# Patient Record
Sex: Female | Born: 1945 | Race: White | Hispanic: No | Marital: Married | Smoking: Never smoker
Health system: Southern US, Community
[De-identification: ages and names within clinical notes are randomized; demographics above are authoritative.]

## PROBLEM LIST (undated history)

## (undated) DIAGNOSIS — L309 Dermatitis, unspecified: Secondary | ICD-10-CM

## (undated) DIAGNOSIS — H269 Unspecified cataract: Secondary | ICD-10-CM

## (undated) DIAGNOSIS — E785 Hyperlipidemia, unspecified: Secondary | ICD-10-CM

## (undated) DIAGNOSIS — M8580 Other specified disorders of bone density and structure, unspecified site: Secondary | ICD-10-CM

## (undated) DIAGNOSIS — I10 Essential (primary) hypertension: Secondary | ICD-10-CM

## (undated) HISTORY — DX: Unspecified cataract: H26.9

## (undated) HISTORY — DX: Essential (primary) hypertension: I10

## (undated) HISTORY — DX: Other specified disorders of bone density and structure, unspecified site: M85.80

## (undated) HISTORY — DX: Dermatitis, unspecified: L30.9

## (undated) HISTORY — DX: Hyperlipidemia, unspecified: E78.5

---

## 2008-11-21 HISTORY — PX: EYE SURGERY: SHX253

## 2011-01-05 ENCOUNTER — Encounter (INDEPENDENT_AMBULATORY_CARE_PROVIDER_SITE_OTHER): Payer: BC Managed Care – PPO | Admitting: Internal Medicine

## 2011-01-05 ENCOUNTER — Other Ambulatory Visit (HOSPITAL_COMMUNITY)
Admission: RE | Admit: 2011-01-05 | Discharge: 2011-01-05 | Disposition: A | Discharge status: Home / Self Care | Payer: BC Managed Care – PPO | Source: Ambulatory Visit | Attending: Internal Medicine | Admitting: Internal Medicine

## 2011-01-05 ENCOUNTER — Other Ambulatory Visit: Payer: Self-pay | Admitting: Internal Medicine

## 2011-01-05 DIAGNOSIS — Z23 Encounter for immunization: Secondary | ICD-10-CM

## 2011-01-05 DIAGNOSIS — Z0000 Encounter for general adult medical examination without abnormal findings: Secondary | ICD-10-CM | POA: Insufficient documentation

## 2011-01-05 DIAGNOSIS — Z1231 Encounter for screening mammogram for malignant neoplasm of breast: Secondary | ICD-10-CM

## 2011-01-05 DIAGNOSIS — I10 Essential (primary) hypertension: Secondary | ICD-10-CM

## 2011-01-05 LAB — HM PAP SMEAR

## 2011-01-06 ENCOUNTER — Other Ambulatory Visit: Payer: BC Managed Care – PPO | Admitting: Internal Medicine

## 2011-01-11 ENCOUNTER — Ambulatory Visit
Admission: RE | Admit: 2011-01-11 | Discharge: 2011-01-11 | Disposition: A | Discharge status: Home / Self Care | Payer: BC Managed Care – PPO | Source: Ambulatory Visit | Attending: Internal Medicine | Admitting: Internal Medicine

## 2011-01-11 DIAGNOSIS — Z1231 Encounter for screening mammogram for malignant neoplasm of breast: Secondary | ICD-10-CM

## 2011-01-13 ENCOUNTER — Other Ambulatory Visit: Payer: Self-pay | Admitting: Internal Medicine

## 2011-01-13 ENCOUNTER — Other Ambulatory Visit: Payer: Self-pay | Admitting: Dermatology

## 2011-01-13 DIAGNOSIS — R928 Other abnormal and inconclusive findings on diagnostic imaging of breast: Secondary | ICD-10-CM

## 2011-01-19 ENCOUNTER — Ambulatory Visit
Admission: RE | Admit: 2011-01-19 | Discharge: 2011-01-19 | Disposition: A | Discharge status: Home / Self Care | Payer: BC Managed Care – PPO | Source: Ambulatory Visit | Attending: Internal Medicine | Admitting: Internal Medicine

## 2011-01-19 DIAGNOSIS — R928 Other abnormal and inconclusive findings on diagnostic imaging of breast: Secondary | ICD-10-CM

## 2011-04-14 ENCOUNTER — Other Ambulatory Visit: Payer: Medicare Other | Admitting: Internal Medicine

## 2011-04-15 ENCOUNTER — Other Ambulatory Visit: Payer: Self-pay | Admitting: *Deleted

## 2011-04-15 DIAGNOSIS — E039 Hypothyroidism, unspecified: Secondary | ICD-10-CM

## 2011-04-15 MED ORDER — LEVOTHYROXINE SODIUM 50 MCG PO TABS: 50.0000 ug | ORAL_TABLET | Freq: Every day | ORAL | Status: DC

## 2011-09-04 ENCOUNTER — Other Ambulatory Visit: Payer: Self-pay | Admitting: Internal Medicine

## 2011-11-07 ENCOUNTER — Encounter: Payer: Self-pay | Admitting: Internal Medicine

## 2011-11-07 ENCOUNTER — Telehealth: Payer: Self-pay | Admitting: Internal Medicine

## 2011-11-07 NOTE — Telephone Encounter (Signed)
Should have had 6 month recheck in November. That is why Rx is running out. Can she come in tomorrow for OV and TSH?

## 2011-11-08 ENCOUNTER — Encounter: Payer: Self-pay | Admitting: Internal Medicine

## 2011-11-08 ENCOUNTER — Ambulatory Visit (INDEPENDENT_AMBULATORY_CARE_PROVIDER_SITE_OTHER): Payer: Medicare Other | Admitting: Internal Medicine

## 2011-11-08 VITALS — BP 136/82 | HR 76 | Temp 98.4°F | Wt 154.0 lb

## 2011-11-08 DIAGNOSIS — M8580 Other specified disorders of bone density and structure, unspecified site: Secondary | ICD-10-CM

## 2011-11-08 DIAGNOSIS — E559 Vitamin D deficiency, unspecified: Secondary | ICD-10-CM

## 2011-11-08 DIAGNOSIS — E039 Hypothyroidism, unspecified: Secondary | ICD-10-CM

## 2011-11-08 DIAGNOSIS — L259 Unspecified contact dermatitis, unspecified cause: Secondary | ICD-10-CM

## 2011-11-08 DIAGNOSIS — Z860100 Personal history of colon polyps, unspecified: Secondary | ICD-10-CM

## 2011-11-08 DIAGNOSIS — M899 Disorder of bone, unspecified: Secondary | ICD-10-CM

## 2011-11-08 DIAGNOSIS — I10 Essential (primary) hypertension: Secondary | ICD-10-CM

## 2011-11-08 DIAGNOSIS — M949 Disorder of cartilage, unspecified: Secondary | ICD-10-CM

## 2011-11-08 DIAGNOSIS — L309 Dermatitis, unspecified: Secondary | ICD-10-CM

## 2011-11-09 ENCOUNTER — Other Ambulatory Visit: Payer: Self-pay

## 2011-11-09 MED ORDER — LEVOTHYROXINE SODIUM 75 MCG PO TABS: 75.0000 ug | ORAL_TABLET | Freq: Every day | ORAL | Status: DC

## 2011-11-21 DIAGNOSIS — E039 Hypothyroidism, unspecified: Secondary | ICD-10-CM | POA: Insufficient documentation

## 2011-11-21 DIAGNOSIS — L309 Dermatitis, unspecified: Secondary | ICD-10-CM | POA: Insufficient documentation

## 2011-11-21 DIAGNOSIS — I10 Essential (primary) hypertension: Secondary | ICD-10-CM | POA: Insufficient documentation

## 2011-11-21 DIAGNOSIS — Z86010 Personal history of colonic polyps: Secondary | ICD-10-CM | POA: Insufficient documentation

## 2011-11-21 DIAGNOSIS — M8580 Other specified disorders of bone density and structure, unspecified site: Secondary | ICD-10-CM | POA: Insufficient documentation

## 2011-11-21 DIAGNOSIS — E559 Vitamin D deficiency, unspecified: Secondary | ICD-10-CM | POA: Insufficient documentation

## 2011-11-21 NOTE — Patient Instructions (Signed)
We will advise you with regard to results of thyroid stimulating hormone assay. You may need increased dose of Synthroid. Otherwise return in 6 months for physical examination. Continue metoprolol for hypertension.

## 2011-11-21 NOTE — Progress Notes (Signed)
  Subjective:    Patient ID: Amanda Potts, female    DOB: March 13, 1946, 65 y.o.   MRN: [id number]  HPI 65 year old white female with hypothyroidism in today for six-month recheck. Also has history of hypertension. She and her husband have been trying to move here permanently from [place] for a number of months. 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 Is allergic to sulfa. Had cataract extraction left eye 2010. Has occasional eczema and uses Diprolene cream 0.05% sparingly. History of mild osteopenia. Was given tetanus immunization update February 2012. History of colonoscopy in 2009. Tried Actonel for osteopenia but it caused myalgias. She switch to Fosamax and gave her a lot of heartburn. Have suggested she just daily calcium and vitamin D at this point in time. Nonsmoker. Social alcohol consumption.  Family history father died at age 33 with dementia and strokes. Mother died at age 39 with COPD and heart failure. Brother died at age 79 of lung cancer and was a smoker. One brother living age 67 in good health. Husband is 40 years old and is in good health. They have 3 adult children. They have one set of twins who are 9 years older son and a daughter. Twin son lives in [place] and when daughter lives in [place]. Oldest son, Rayna Sexton, lives here in [place]. They have several grandchildren.   Review of Systems     Objective:   Physical Exam neck supple without thyromegaly or carotid bruits; chest clear to auscultation; cardiac exam regular rate and rhythm; extremities without edema        Assessment & Plan:  Hypertension  Hypothyroidism  Eczema  Osteopenia  Vitamin D deficiency   Plan: Patient is return in 6 months for physical examination.

## 2012-01-09 ENCOUNTER — Telehealth: Payer: Self-pay | Admitting: Internal Medicine

## 2012-01-09 DIAGNOSIS — I10 Essential (primary) hypertension: Secondary | ICD-10-CM

## 2012-01-09 MED ORDER — METOPROLOL SUCCINATE 12.5 MG HALF TABLET: 12.5000 mg | ORAL_TABLET | Freq: Every day | ORAL | Status: DC

## 2012-01-09 NOTE — Telephone Encounter (Signed)
Rx called to [facility] [place]

## 2012-02-06 ENCOUNTER — Other Ambulatory Visit: Payer: Medicare Other | Admitting: Internal Medicine

## 2012-02-24 ENCOUNTER — Other Ambulatory Visit: Payer: Medicare Other | Admitting: Internal Medicine

## 2012-02-24 DIAGNOSIS — E039 Hypothyroidism, unspecified: Secondary | ICD-10-CM

## 2012-03-01 ENCOUNTER — Other Ambulatory Visit: Payer: Self-pay | Admitting: Internal Medicine

## 2012-03-01 DIAGNOSIS — Z1231 Encounter for screening mammogram for malignant neoplasm of breast: Secondary | ICD-10-CM

## 2012-03-12 ENCOUNTER — Ambulatory Visit
Admission: RE | Admit: 2012-03-12 | Discharge: 2012-03-12 | Disposition: A | Discharge status: Home / Self Care | Payer: Medicare Other | Source: Ambulatory Visit | Attending: Internal Medicine | Admitting: Internal Medicine

## 2012-03-12 DIAGNOSIS — Z1231 Encounter for screening mammogram for malignant neoplasm of breast: Secondary | ICD-10-CM | POA: Diagnosis not present

## 2012-03-14 ENCOUNTER — Other Ambulatory Visit: Payer: Self-pay

## 2012-03-14 MED ORDER — LEVOTHYROXINE SODIUM 75 MCG PO TABS: 75.0000 ug | ORAL_TABLET | Freq: Every day | ORAL | Status: DC

## 2012-05-10 ENCOUNTER — Other Ambulatory Visit: Payer: Self-pay | Admitting: Internal Medicine

## 2012-05-28 ENCOUNTER — Other Ambulatory Visit: Payer: Medicare Other | Admitting: Internal Medicine

## 2012-05-28 DIAGNOSIS — E559 Vitamin D deficiency, unspecified: Secondary | ICD-10-CM

## 2012-05-28 DIAGNOSIS — E039 Hypothyroidism, unspecified: Secondary | ICD-10-CM | POA: Diagnosis not present

## 2012-05-28 DIAGNOSIS — I10 Essential (primary) hypertension: Secondary | ICD-10-CM | POA: Diagnosis not present

## 2012-05-28 LAB — CBC WITH DIFFERENTIAL/PLATELET
Eosinophils Absolute: 0.2 10*3/uL (ref 0.0–0.7)
Eosinophils Relative: 4 % (ref 0–5)
HCT: 40.9 % (ref 36.0–46.0)
Hemoglobin: 13.2 g/dL (ref 12.0–15.0)
Lymphocytes Relative: 32 % (ref 12–46)
Lymphs Abs: 1.9 10*3/uL (ref 0.7–4.0)
MCH: 29.9 pg (ref 26.0–34.0)
MCV: 92.5 fL (ref 78.0–100.0)
Monocytes Absolute: 0.4 10*3/uL (ref 0.1–1.0)
Monocytes Relative: 8 % (ref 3–12)
RBC: 4.42 MIL/uL (ref 3.87–5.11)

## 2012-05-28 LAB — LIPID PANEL
Cholesterol: 208 mg/dL — ABNORMAL HIGH (ref 0–200)
HDL: 67 mg/dL (ref >39)
Total CHOL/HDL Ratio: 3.1 Ratio

## 2012-05-28 LAB — COMPREHENSIVE METABOLIC PANEL
CO2: 26 mEq/L (ref 19–32)
Calcium: 9 mg/dL (ref 8.4–10.5)
Chloride: 106 mEq/L (ref 96–112)
Creat: 0.74 mg/dL (ref 0.50–1.10)
Glucose, Bld: 98 mg/dL (ref 70–99)
Total Bilirubin: 0.5 mg/dL (ref 0.3–1.2)
Total Protein: 6.6 g/dL (ref 6.0–8.3)

## 2012-05-28 LAB — TSH: TSH: 0.681 u[IU]/mL (ref 0.350–4.500)

## 2012-05-29 ENCOUNTER — Encounter: Payer: Self-pay | Admitting: Internal Medicine

## 2012-05-29 ENCOUNTER — Ambulatory Visit (INDEPENDENT_AMBULATORY_CARE_PROVIDER_SITE_OTHER): Payer: Medicare Other | Admitting: Internal Medicine

## 2012-05-29 VITALS — BP 140/78 | HR 68 | Temp 98.8°F | Ht 66.0 in | Wt 154.0 lb

## 2012-05-29 DIAGNOSIS — E039 Hypothyroidism, unspecified: Secondary | ICD-10-CM

## 2012-05-29 DIAGNOSIS — Z8639 Personal history of other endocrine, nutritional and metabolic disease: Secondary | ICD-10-CM

## 2012-05-29 DIAGNOSIS — Z0000 Encounter for general adult medical examination without abnormal findings: Secondary | ICD-10-CM

## 2012-05-29 DIAGNOSIS — M899 Disorder of bone, unspecified: Secondary | ICD-10-CM | POA: Diagnosis not present

## 2012-05-29 DIAGNOSIS — L259 Unspecified contact dermatitis, unspecified cause: Secondary | ICD-10-CM | POA: Diagnosis not present

## 2012-05-29 DIAGNOSIS — M949 Disorder of cartilage, unspecified: Secondary | ICD-10-CM | POA: Diagnosis not present

## 2012-05-29 DIAGNOSIS — M8580 Other specified disorders of bone density and structure, unspecified site: Secondary | ICD-10-CM

## 2012-05-29 DIAGNOSIS — Z23 Encounter for immunization: Secondary | ICD-10-CM

## 2012-05-29 DIAGNOSIS — I10 Essential (primary) hypertension: Secondary | ICD-10-CM

## 2012-05-29 LAB — VITAMIN D 25 HYDROXY (VIT D DEFICIENCY, FRACTURES): Vit D, 25-Hydroxy: 36 ng/mL (ref 30–89)

## 2012-05-29 MED ORDER — PNEUMOCOCCAL VAC POLYVALENT 25 MCG/0.5ML IJ INJ: 0.5000 mL | INJECTION | Freq: Once | INTRAMUSCULAR | Status: DC

## 2012-05-30 LAB — POCT URINALYSIS DIPSTICK
Bilirubin, UA: NEGATIVE
Blood, UA: NEGATIVE
Glucose, UA: NEGATIVE
Spec Grav, UA: 1.01
Urobilinogen, UA: NEGATIVE

## 2012-06-16 ENCOUNTER — Encounter: Payer: Self-pay | Admitting: Internal Medicine

## 2012-06-16 NOTE — Patient Instructions (Addendum)
Continue same medications and return in one year. 

## 2012-06-16 NOTE — Progress Notes (Signed)
Subjective:    Patient ID: Amanda Potts, female    DOB: [birth year], 66 y.o.   MRN: [id number]  HPI 66 year old white female with history of hypertension, eczema, osteopenia, hypothyroidism and vitamin D deficiency her health maintenance and evaluation of medical problems. Patient and her husband have moved here nail permanently from [place]. 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 Is allergic to sulfa. Had cataract extraction left eye 2010. History of mild osteopenia. History of colonoscopy 2009. Tried Actonel for osteopenia but it caused myalgias. She tried Fosamax but he gave her a lot of heartburn. At this point we are just going to treat her with calcium and vitamin D. She is a nonsmoker with social alcohol consumption.  Family history: Father died at age 32 with dementia and stroke. Mother died at age 66 with COPD and heart failure. One brother died at age 66 of lung cancer and was a smoker. One brother living age 66 in good health. Husband is in good health. They have 3 adult children. They have one set of twins who are 48 years old a son and a daughter. Twin son lives in [place] and daughter resides in [place]. Oldest son resides here in [place]. They have several grandchildren.   Had mammogram February 2012.  Subjective:   Patient presents for Medicare Annual/Subsequent preventive examination.   Review Past Medical/Family/Social: as above   Risk Factors  Current exercise habits: walking Dietary issues discussed: low fat low carb diet suggested   Cardiac risk factors:  Depression Screen  (Note: if answer to either of the following is "Yes", a more complete depression screening is indicated)   Over the past two weeks, have you felt down, depressed or hopeless? No  Over the past two weeks, have you felt little interest or pleasure in doing things? No Have you lost  interest or pleasure in daily life? No Do you often feel hopeless? No Do you cry easily over simple problems? No   Activities of Daily Living  In your present state of health, do you have any difficulty performing the following activities?:   Driving? No  Managing money? No  Feeding yourself? No  Getting from bed to chair? No  Climbing a flight of stairs? No  Preparing food and eating?: No  Bathing or showering? No  Getting dressed: No  Getting to the toilet? No  Using the toilet:No  Moving around from place to place: No  In the past year have you fallen or had a near fall?:No  Are you sexually active? No  Do you have more than one partner? No   Hearing Difficulties: No  Do you often ask people to speak up or repeat themselves? No  Do you experience ringing or noises in your ears? No  Do you have difficulty understanding soft or whispered voices? No  Do you feel that you have a problem with memory? No Do you often misplace items? No    Home Safety:  Do you have a smoke alarm at your residence? Yes Do you have grab bars in the bathroom?yes Do you have throw rugs in your house?yes   Cognitive Testing  Alert? Yes Normal Appearance?Yes  Oriented to Potts? Yes Place? Yes  Time? Yes  Recall of three objects? Yes  Can perform simple calculations? Yes  Displays appropriate judgment?Yes  Can read the correct time from a watch face?Yes   List  the Names of Other Physician/Practitioners you currently use:  See referral list for the physicians patient is currently seeing.     Review of Systems: see EPIC ROS   Objective:     General appearance: Appears stated age  Head: Normocephalic, without obvious abnormality, atraumatic  Eyes: conj clear, EOMi PEERLA  Ears: normal TM's and external ear canals both ears  Nose: Nares normal. Septum midline. Mucosa normal. No drainage or sinus tenderness.  Throat: lips, mucosa, and tongue normal; teeth and gums normal  Neck: no  adenopathy, no carotid bruit, no JVD, supple, symmetrical, trachea midline and thyroid not enlarged, symmetric, no tenderness/mass/nodules  No CVA tenderness.  Lungs: clear to auscultation bilaterally  Breasts: normal appearance, no masses or tenderness,  Heart: regular rate and rhythm, S1, S2 normal, no murmur, click, rub or gallop  Abdomen: soft, non-tender; bowel sounds normal; no masses, no organomegaly  Musculoskeletal: ROM normal in all joints, no crepitus, no deformity, Normal muscle strengthen. Back  is symmetric, no curvature. Skin: Skin color, texture, turgor normal. No rashes or lesions  Lymph nodes: Cervical, supraclavicular, and axillary nodes normal.  Neurologic: CN 2 -12 Normal, Normal symmetric reflexes. Normal coordination and gait  Psych: Alert & Oriented x 3, Mood appear stable.    Assessment:    Annual wellness medicare exam   Plan:    During the course of the visit the patient was educated and counseled about appropriate screening and preventive services including:        Patient Instructions (the written plan) was given to the patient.  Medicare Attestation  I have personally reviewed:  The patient's medical and social history  Their use of alcohol, tobacco or illicit drugs  Their current medications and supplements  The patient's functional ability including ADLs,fall risks, home safety risks, cognitive, and hearing and visual impairment  Diet and physical activities  Evidence for depression or mood disorders  The patient's weight, height, BMI, and visual acuity have been recorded in the chart. I have made referrals, counseling, and provided education to the patient based on review of the above and I have provided the patient with a written personalized care plan for preventive services.       Review of Systems  Constitutional: Negative.   All other systems reviewed and are negative.       Objective:   Physical Exam  Vitals  reviewed. Constitutional: She is oriented to Potts, place, and time. She appears well-developed and well-nourished. No distress.  HENT:  Head: Normocephalic and atraumatic.  Right Ear: External ear normal.  Left Ear: External ear normal.  Mouth/Throat: Oropharynx is clear and moist. No oropharyngeal exudate.  Eyes: Conjunctivae and EOM are normal. Pupils are equal, round, and reactive to light. Right eye exhibits no discharge. Left eye exhibits no discharge. No scleral icterus.  Neck: Normal range of motion. Neck supple. No JVD present. No thyromegaly present.  Cardiovascular: Normal rate, regular rhythm, normal heart sounds and intact distal pulses.   No murmur heard. Pulmonary/Chest: Effort normal and breath sounds normal. No respiratory distress. She has no rales.       Breasts normal female  Abdominal: Soft. Bowel sounds are normal. She exhibits no distension and no mass. There is no tenderness. There is no guarding.  Genitourinary:       Pap smear done 01/06/2011  Musculoskeletal: Normal range of motion. She exhibits no edema.  Lymphadenopathy:    She has no cervical adenopathy.  Neurological: She is alert and oriented to  Potts, place, and time. She has normal reflexes. No cranial nerve deficit. Coordination normal.  Skin: Skin is warm and dry. No rash noted. She is not diaphoretic.  Psychiatric: She has a normal mood and affect. Her behavior is normal. Thought content normal.          Assessment & Plan:  Hypertension  Hypothyroidism  Osteopenia  History of vitamin D deficiency  Eczema  Plan: Patient to return in one year or as needed. Patient had colonoscopy in 2009. Was given prescription to get Zostavax vaccine done at drugstore. Given Pneumovax immunization today. Had Tdap 01/06/2011

## 2012-06-25 ENCOUNTER — Telehealth: Payer: Self-pay

## 2012-06-25 DIAGNOSIS — M8580 Other specified disorders of bone density and structure, unspecified site: Secondary | ICD-10-CM

## 2012-06-25 NOTE — Telephone Encounter (Signed)
Patient informed/ order entered in Epic for Bone Density. She may call them to schedule.

## 2012-07-13 ENCOUNTER — Telehealth: Payer: Self-pay | Admitting: Internal Medicine

## 2012-07-13 NOTE — Telephone Encounter (Signed)
Rx verified with pharmacist/should be on Metoprolol tartrate 25mg  1/2 tablet daily

## 2012-07-16 ENCOUNTER — Other Ambulatory Visit: Payer: Medicare Other

## 2012-07-19 ENCOUNTER — Ambulatory Visit
Admission: RE | Admit: 2012-07-19 | Discharge: 2012-07-19 | Disposition: A | Discharge status: Home / Self Care | Payer: Medicare Other | Source: Ambulatory Visit | Attending: Internal Medicine | Admitting: Internal Medicine

## 2012-07-19 DIAGNOSIS — M8580 Other specified disorders of bone density and structure, unspecified site: Secondary | ICD-10-CM

## 2012-07-19 DIAGNOSIS — M899 Disorder of bone, unspecified: Secondary | ICD-10-CM | POA: Diagnosis not present

## 2012-12-04 ENCOUNTER — Encounter: Payer: Self-pay | Admitting: Internal Medicine

## 2012-12-04 ENCOUNTER — Ambulatory Visit (INDEPENDENT_AMBULATORY_CARE_PROVIDER_SITE_OTHER): Payer: Medicare Other | Admitting: Internal Medicine

## 2012-12-04 ENCOUNTER — Other Ambulatory Visit: Payer: Self-pay | Admitting: Internal Medicine

## 2012-12-04 VITALS — BP 126/70 | HR 72 | Temp 98.2°F | Wt 141.5 lb

## 2012-12-04 DIAGNOSIS — E039 Hypothyroidism, unspecified: Secondary | ICD-10-CM

## 2012-12-04 DIAGNOSIS — I10 Essential (primary) hypertension: Secondary | ICD-10-CM

## 2012-12-04 LAB — TSH: TSH: 3.935 u[IU]/mL (ref 0.350–4.500)

## 2012-12-04 MED ORDER — METOPROLOL SUCCINATE 12.5 MG HALF TABLET: 12.5000 mg | ORAL_TABLET | Freq: Every day | ORAL | Status: DC

## 2012-12-04 MED ORDER — LEVOTHYROXINE SODIUM 75 MCG PO TABS: 75.0000 ug | ORAL_TABLET | Freq: Every day | ORAL | Status: DC

## 2012-12-04 NOTE — Addendum Note (Signed)
Addended by: Judy Pimple on: 12/04/2012 11:32 AM   Modules accepted: Orders

## 2012-12-04 NOTE — Progress Notes (Signed)
  Subjective:    Patient ID: Amanda Potts, female    DOB: December 21, 1945, 67 y.o.   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 History of hypertension, osteopenia, hypothyroidism and vitamin D deficiency. Declines influenza immunization. No complaints or problems today. TSH drawn and is pending. Remains on metoprolol for hypertension with good control of blood pressure. Is on Synthroid for hypothyroidism 0.075 mg daily. Takes calcium and vitamin D.  No falls in the past year and is not depressed.     Review of Systems     Objective:   Physical Exam chest is clear to auscultation. Cardiac exam regular rate and rhythm normal S1 and S2. Skin is pale warm and dry. No thyromegaly. No carotid bruits or JVD. Extremities without edema        Assessment & Plan:  Hypothyroidism-TSH pending  History of vitamin D deficiency-continue calcium and vitamin D supplementation  Hypertension-well controlled on metoprolol  Osteopenia-treated with calcium and vitamin D  History of eczema  Plan: Return in late July for physical examination

## 2012-12-04 NOTE — Patient Instructions (Addendum)
Continue same meds and recheck in 6 months

## 2012-12-07 ENCOUNTER — Other Ambulatory Visit: Payer: Self-pay | Admitting: Internal Medicine

## 2012-12-07 NOTE — Telephone Encounter (Signed)
Keep getting Rx request. Please refill for 6 months.

## 2012-12-07 NOTE — Telephone Encounter (Signed)
Patient is under assumption that her Synthroid dose is 50 mcg, but it was changed approx 1 year ago to 75 mcg when TSH was elevated. Left a message for her to call us if she had questions, and advised pharmacist of correct dose.

## 2013-01-16 DIAGNOSIS — H2510 Age-related nuclear cataract, unspecified eye: Secondary | ICD-10-CM | POA: Diagnosis not present

## 2013-04-29 ENCOUNTER — Other Ambulatory Visit: Payer: Self-pay

## 2013-04-29 DIAGNOSIS — Z1231 Encounter for screening mammogram for malignant neoplasm of breast: Secondary | ICD-10-CM

## 2013-05-23 ENCOUNTER — Ambulatory Visit: Payer: Medicare Other

## 2013-06-05 ENCOUNTER — Other Ambulatory Visit: Payer: Self-pay | Admitting: Internal Medicine

## 2013-06-12 ENCOUNTER — Ambulatory Visit
Admission: RE | Admit: 2013-06-12 | Discharge: 2013-06-12 | Disposition: A | Discharge status: Home / Self Care | Payer: Medicare Other | Source: Ambulatory Visit

## 2013-06-12 DIAGNOSIS — Z1231 Encounter for screening mammogram for malignant neoplasm of breast: Secondary | ICD-10-CM

## 2013-06-18 ENCOUNTER — Other Ambulatory Visit: Payer: Medicare Other | Admitting: Internal Medicine

## 2013-06-20 ENCOUNTER — Encounter: Payer: Medicare Other | Admitting: Internal Medicine

## 2013-07-02 ENCOUNTER — Other Ambulatory Visit: Payer: Medicare Other | Admitting: Internal Medicine

## 2013-07-02 ENCOUNTER — Other Ambulatory Visit: Payer: Self-pay | Admitting: Internal Medicine

## 2013-07-02 DIAGNOSIS — I10 Essential (primary) hypertension: Secondary | ICD-10-CM

## 2013-07-02 DIAGNOSIS — Z1329 Encounter for screening for other suspected endocrine disorder: Secondary | ICD-10-CM

## 2013-07-02 DIAGNOSIS — Z13220 Encounter for screening for lipoid disorders: Secondary | ICD-10-CM

## 2013-07-02 DIAGNOSIS — E039 Hypothyroidism, unspecified: Secondary | ICD-10-CM

## 2013-07-02 DIAGNOSIS — R7301 Impaired fasting glucose: Secondary | ICD-10-CM | POA: Diagnosis not present

## 2013-07-02 DIAGNOSIS — M899 Disorder of bone, unspecified: Secondary | ICD-10-CM | POA: Diagnosis not present

## 2013-07-02 DIAGNOSIS — M949 Disorder of cartilage, unspecified: Secondary | ICD-10-CM | POA: Diagnosis not present

## 2013-07-02 LAB — LIPID PANEL
Cholesterol: 191 mg/dL (ref 0–200)
HDL: 70 mg/dL (ref >39)
LDL Cholesterol: 105 mg/dL — ABNORMAL HIGH (ref 0–99)
Total CHOL/HDL Ratio: 2.7 Ratio
Triglycerides: 82 mg/dL (ref <150)
VLDL: 16 mg/dL (ref 0–40)

## 2013-07-02 LAB — COMPREHENSIVE METABOLIC PANEL
Alkaline Phosphatase: 41 U/L (ref 39–117)
BUN: 19 mg/dL (ref 6–23)
Creat: 1.03 mg/dL (ref 0.50–1.10)
Glucose, Bld: 101 mg/dL — ABNORMAL HIGH (ref 70–99)
Sodium: 139 mEq/L (ref 135–145)
Total Bilirubin: 0.8 mg/dL (ref 0.3–1.2)
Total Protein: 6.2 g/dL (ref 6.0–8.3)

## 2013-07-02 LAB — CBC WITH DIFFERENTIAL/PLATELET
Basophils Relative: 1 % (ref 0–1)
Eosinophils Absolute: 0.2 10*3/uL (ref 0.0–0.7)
Eosinophils Relative: 3 % (ref 0–5)
Hemoglobin: 12.8 g/dL (ref 12.0–15.0)
MCH: 30.6 pg (ref 26.0–34.0)
MCHC: 34 g/dL (ref 30.0–36.0)
MCV: 90 fL (ref 78.0–100.0)
Monocytes Relative: 10 % (ref 3–12)
Neutrophils Relative %: 56 % (ref 43–77)

## 2013-07-04 ENCOUNTER — Encounter: Payer: Self-pay | Admitting: Internal Medicine

## 2013-07-04 ENCOUNTER — Ambulatory Visit (INDEPENDENT_AMBULATORY_CARE_PROVIDER_SITE_OTHER): Payer: Medicare Other | Admitting: Internal Medicine

## 2013-07-04 VITALS — BP 136/82 | HR 80 | Temp 99.3°F | Ht 65.5 in | Wt 131.5 lb

## 2013-07-04 DIAGNOSIS — M899 Disorder of bone, unspecified: Secondary | ICD-10-CM

## 2013-07-04 DIAGNOSIS — M8580 Other specified disorders of bone density and structure, unspecified site: Secondary | ICD-10-CM

## 2013-07-04 DIAGNOSIS — I10 Essential (primary) hypertension: Secondary | ICD-10-CM | POA: Diagnosis not present

## 2013-07-04 DIAGNOSIS — E039 Hypothyroidism, unspecified: Secondary | ICD-10-CM

## 2013-07-04 DIAGNOSIS — Z8639 Personal history of other endocrine, nutritional and metabolic disease: Secondary | ICD-10-CM

## 2013-07-04 DIAGNOSIS — Z872 Personal history of diseases of the skin and subcutaneous tissue: Secondary | ICD-10-CM

## 2013-07-04 DIAGNOSIS — Z86010 Personal history of colonic polyps: Secondary | ICD-10-CM

## 2013-07-04 DIAGNOSIS — Z0000 Encounter for general adult medical examination without abnormal findings: Secondary | ICD-10-CM

## 2013-07-04 LAB — POCT URINALYSIS DIPSTICK
Bilirubin, UA: NEGATIVE
Ketones, UA: NEGATIVE
Protein, UA: NEGATIVE
Spec Grav, UA: 1.02
pH, UA: 6

## 2013-07-04 NOTE — Progress Notes (Signed)
Subjective:    Patient ID: Amanda Potts, female    DOB: 1946-03-04, 67 y.o.   MRN: [id number]  HPI  67 year old White female for health maintenance and evaluation of medical problems. Hx HTN, osteopenia, history of vitamin D deficiency, and hypothyroidism. Has lost weight this  past year through Weight [employer] and now is on maintenance therapy.  Has had cataract extraction of left eye in 2010. Has been told needs right eye done in the future. Says Dr. Kipp Laurence in [place] is eye physician. Colonoscopy done in 2009 with no polyps being found but pt says had colon polyps on previous study before that but no info on what type of polyps were found.  Family History: brother living age 2 in good health. Father died at age 14 with dementia and stroke. Mother died at age 51 with COPD and heart failure. One brother died at age 22 with lung cancer and was a smoker.  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 Nonsmoker. Social alcohol consumption. 3 adult children. One set of twins a son and a daughter who are in their 30s. Daughter resides in [place]. Oldest son lives here in [place]. Has several grandchildren.  History of mild osteopenia. She tried Actonel but it caused myalgias. She tried Fosamax but it caused heartburn. At this point where just couldn't treat her with calcium and vitamin D.  She is allergic to sulfa.    Review of Systems  Constitutional: Negative.   HENT: Positive for postnasal drip.        A.M post nasal drip.  Eyes:       Has cataract to be extracted in next few months  Respiratory: Negative.   Endocrine: Negative.   Genitourinary:       Some urgency  but not incontinence  Allergic/Immunologic: Negative.   Neurological: Negative.   Hematological: Negative.   Psychiatric/Behavioral: Negative.        Objective:   Physical Exam  Vitals  reviewed. Constitutional: She is oriented to person, place, and time. She appears well-developed and well-nourished. No distress.  HENT:  Head: Normocephalic and atraumatic.  Right Ear: External ear normal.  Left Ear: External ear normal.  Mouth/Throat: Oropharynx is clear and moist. No oropharyngeal exudate.  Eyes: Conjunctivae and EOM are normal. Pupils are equal, round, and reactive to light. Right eye exhibits no discharge. Left eye exhibits no discharge.  Neck: Neck supple. No JVD present.  Cardiovascular: Normal rate, regular rhythm and normal heart sounds.   No murmur heard. Pulmonary/Chest: Effort normal and breath sounds normal. No respiratory distress. She has no wheezes. She has no rales.  Breasts normal female  Abdominal: Soft. Bowel sounds are normal. She exhibits no distension and no mass. There is no rebound and no guarding.  Genitourinary:  Pap done 2012. Bimanual normal.  Musculoskeletal: Normal range of motion. She exhibits no edema.  Lymphadenopathy:    She has no cervical adenopathy.  Neurological: She is alert and oriented to person, place, and time. She has normal reflexes. Coordination normal.  Skin: Skin is warm and dry. No rash noted. She is not diaphoretic.  Psychiatric: She has a normal mood and affect. Her behavior is normal. Judgment and thought content normal.          Assessment & Plan:    Hypertension-stable on metoprolol  Hypothyroidism-stable on Synthroid  History of colon polyps-colonoscopy  done 2009. No polyps noted on that report but had colon polyps on previous study according to patient  .History of eczema- stable  History of vitamin D deficiency  History of mild osteopenia    Plan: Return in 6 months or as needed. Continue same medications. Recommend annual flu vaccine. Recommend annual mammogram.      Subjective:   Patient presents for Medicare Annual/Subsequent preventive examination.   Review Past Medical/Family/Social:  see EPIC   Risk Factors  Current exercise habits: walking Dietary issues discussed: lost weight from weight watchers over past year  Cardiac risk factors:  Depression Screen  (Note: if answer to either of the following is "Yes", a more complete depression screening is indicated)   Over the past two weeks, have you felt down, depressed or hopeless? No  Over the past two weeks, have you felt little interest or pleasure in doing things? No Have you lost interest or pleasure in daily life? No Do you often feel hopeless? No Do you cry easily over simple problems? No   Activities of Daily Living  In your present state of health, do you have any difficulty performing the following activities?:   Driving? No  Managing money? No  Feeding yourself? No  Getting from bed to chair? No  Climbing a flight of stairs? No  Preparing food and eating?: No  Bathing or showering? No  Getting dressed: No  Getting to the toilet? No  Using the toilet:No  Moving around from place to place: No  In the past year have you fallen or had a near fall?:No  Are you sexually active? No  Do you have more than one partner? No   Hearing Difficulties: No  Do you often ask people to speak up or repeat themselves? Sometimes with noise Do you experience ringing or noises in your ears? No  Do you have difficulty understanding soft or whispered voices? No  Do you feel that you have a problem with memory? No Do you often misplace items? No    Home Safety:  Do you have a smoke alarm at your residence? Yes Do you have grab bars in the bathroom? yes Do you have throw rugs in your house? yes   Cognitive Testing  Alert? Yes Normal Appearance?Yes  Oriented to person? Yes Place? Yes  Time? Yes  Recall of three objects? Yes  Can perform simple calculations? Yes  Displays appropriate judgment?Yes  Can read the correct time from a watch face?Yes   List the Names of Other Physician/Practitioners you currently  use:  See referral list for the physicians patient is currently seeing. Dr. Kipp Laurence  opthalmologist in [place] plans to do right cataract extraction in near future    Review of Systems: see above   Objective:     General appearance: Appears stated age and mildly obese  Head: Normocephalic, without obvious abnormality, atraumatic  Eyes: conj clear, EOMi PEERLA  Ears: normal TM's and external ear canals both ears  Nose: Nares normal. Septum midline. Mucosa normal. No drainage or sinus tenderness.  Throat: lips, mucosa, and tongue normal; teeth and gums normal  Neck: no adenopathy, no carotid bruit, no JVD, supple, symmetrical, trachea midline and thyroid not enlarged, symmetric, no tenderness/mass/nodules  No CVA tenderness.  Lungs: clear to auscultation bilaterally  Breasts: normal appearance, no masses or tenderness, top of the pacemaker on left upper chest. Incision well-healed. It is tender.  Heart: regular rate and rhythm, S1, S2 normal, no murmur, click, rub or gallop  Abdomen: soft, non-tender; bowel sounds normal; no masses, no organomegaly  Musculoskeletal: ROM normal in all joints, no crepitus, no deformity, Normal muscle strengthen. Back  is symmetric, no curvature. Skin: Skin color, texture, turgor normal. No rashes or lesions  Lymph nodes: Cervical, supraclavicular, and axillary nodes normal.  Neurologic: CN 2 -12 Normal, Normal symmetric reflexes. Normal coordination and gait  Psych: Alert & Oriented x 3, Mood appear stable.    Assessment:    Annual wellness medicare exam   Plan:    During the course of the visit the patient was educated and counseled about appropriate screening and preventive services including:   Mammogram done in July 2014     Patient Instructions (the written plan) was given to the patient.  Medicare Attestation  I have personally reviewed:  The patient's medical and social history  Their use of alcohol, tobacco or illicit drugs   Their current medications and supplements  The patient's functional ability including ADLs,fall risks, home safety risks, cognitive, and hearing and visual impairment  Diet and physical activities  Evidence for depression or mood disorders  The patient's weight, height, BMI, and visual acuity have been recorded in the chart. I have made referrals, counseling, and provided education to the patient based on review of the above and I have provided the patient with a written personalized care plan for preventive services.

## 2013-07-04 NOTE — Patient Instructions (Addendum)
Continue with same meds and return in 6 months for TSH  and   BP check.

## 2013-07-08 ENCOUNTER — Other Ambulatory Visit: Payer: Self-pay | Admitting: Internal Medicine

## 2013-09-26 ENCOUNTER — Other Ambulatory Visit: Payer: Self-pay

## 2014-01-01 DIAGNOSIS — H2510 Age-related nuclear cataract, unspecified eye: Secondary | ICD-10-CM | POA: Diagnosis not present

## 2014-01-03 ENCOUNTER — Other Ambulatory Visit: Payer: Self-pay | Admitting: Internal Medicine

## 2014-01-06 ENCOUNTER — Other Ambulatory Visit: Payer: Medicare Other | Admitting: Internal Medicine

## 2014-01-06 DIAGNOSIS — E039 Hypothyroidism, unspecified: Secondary | ICD-10-CM

## 2014-01-06 MED ORDER — SYNTHROID 75 MCG PO TABS: 75.0000 ug | ORAL_TABLET | Freq: Every day | ORAL | Status: DC

## 2014-01-06 NOTE — Addendum Note (Signed)
Addended by: Brett Canales on: 01/06/2014 09:22 AM   Modules accepted: Orders

## 2014-01-07 ENCOUNTER — Ambulatory Visit: Payer: Medicare Other | Admitting: Internal Medicine

## 2014-01-07 LAB — TSH: TSH: 2.428 u[IU]/mL (ref 0.350–4.500)

## 2014-01-20 ENCOUNTER — Ambulatory Visit (INDEPENDENT_AMBULATORY_CARE_PROVIDER_SITE_OTHER): Payer: Medicare Other | Admitting: Internal Medicine

## 2014-01-20 ENCOUNTER — Encounter: Payer: Self-pay | Admitting: Internal Medicine

## 2014-01-20 VITALS — BP 126/72 | HR 60 | Temp 98.7°F | Wt 132.0 lb

## 2014-01-20 DIAGNOSIS — E039 Hypothyroidism, unspecified: Secondary | ICD-10-CM | POA: Diagnosis not present

## 2014-01-20 DIAGNOSIS — I10 Essential (primary) hypertension: Secondary | ICD-10-CM | POA: Diagnosis not present

## 2014-01-20 MED ORDER — METOPROLOL SUCCINATE ER 25 MG PO TB24: 25.0000 mg | ORAL_TABLET | Freq: Every day | ORAL | Status: DC

## 2014-01-20 MED ORDER — METOPROLOL SUCCINATE 12.5 MG HALF TABLET: 12.5000 mg | ORAL_TABLET | Freq: Every day | ORAL | Status: DC

## 2014-01-20 NOTE — Progress Notes (Signed)
   Subjective:    Patient ID: Amanda Potts, female    DOB: 1946-01-15, 68 y.o.   MRN: [id number]  HPI  Six-month followup on hypertension and hypothyroidism. TSH normal on current dose of Synthroid. Takes one half of a 25 mg Toprol-XL tablet daily. This was refilled. Synthroid was previously refilled in February. No other complaints or problems. She is to have cataract extraction of right eye March 24 by Dr. Shonna Chock in [place]. Had cataract extraction left eye 5 years ago.    Review of Systems     Objective:   Physical Exam skin is warm and dry. Nodes none. Neck is supple without JVD thyromegaly or carotid bruits. Chest clear to auscultation. Cardiac exam regular rate and rhythm. Extremities without edema        Assessment & Plan:  Hypothyroidism-stable on current dose of Synthroid  Hypertension-blood pressure excellent on metoprolol  Plan: Return in 6 months for physical exam

## 2014-01-20 NOTE — Patient Instructions (Signed)
Continue same dose of Synthroid and continue metoprolol 25 mg one half tablet daily. RTC in 6 months

## 2014-02-11 DIAGNOSIS — H2589 Other age-related cataract: Secondary | ICD-10-CM | POA: Diagnosis not present

## 2014-02-11 DIAGNOSIS — H52209 Unspecified astigmatism, unspecified eye: Secondary | ICD-10-CM | POA: Diagnosis not present

## 2014-02-11 DIAGNOSIS — H269 Unspecified cataract: Secondary | ICD-10-CM | POA: Diagnosis not present

## 2014-02-11 DIAGNOSIS — H2510 Age-related nuclear cataract, unspecified eye: Secondary | ICD-10-CM | POA: Diagnosis not present

## 2014-07-18 ENCOUNTER — Other Ambulatory Visit: Payer: Self-pay

## 2014-07-18 DIAGNOSIS — Z1231 Encounter for screening mammogram for malignant neoplasm of breast: Secondary | ICD-10-CM

## 2014-07-24 ENCOUNTER — Ambulatory Visit
Admission: RE | Admit: 2014-07-24 | Discharge: 2014-07-24 | Disposition: A | Discharge status: Home / Self Care | Payer: Medicare Other | Source: Ambulatory Visit

## 2014-07-24 DIAGNOSIS — Z1231 Encounter for screening mammogram for malignant neoplasm of breast: Secondary | ICD-10-CM

## 2014-07-24 IMAGING — MG MM SCREEN MAMMOGRAM BILATERAL
4 series · 4 of 4 positions shown · non-contrast
Comparison: Previous exam(s).

CLINICAL DATA: Screening.

EXAM:
DIGITAL SCREENING BILATERAL MAMMOGRAM WITH CAD

[R CC]
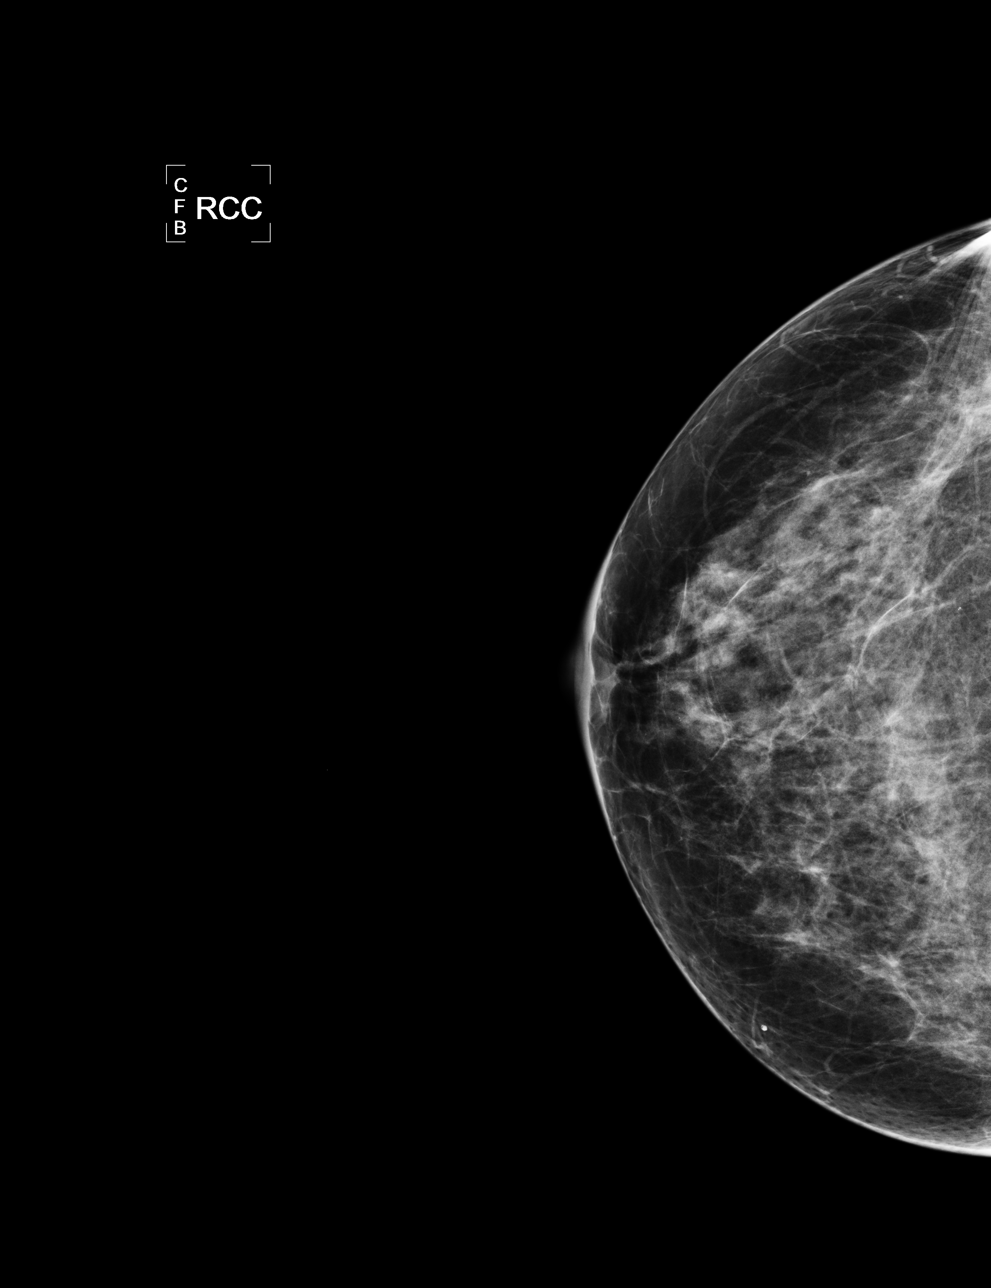

[L CC]
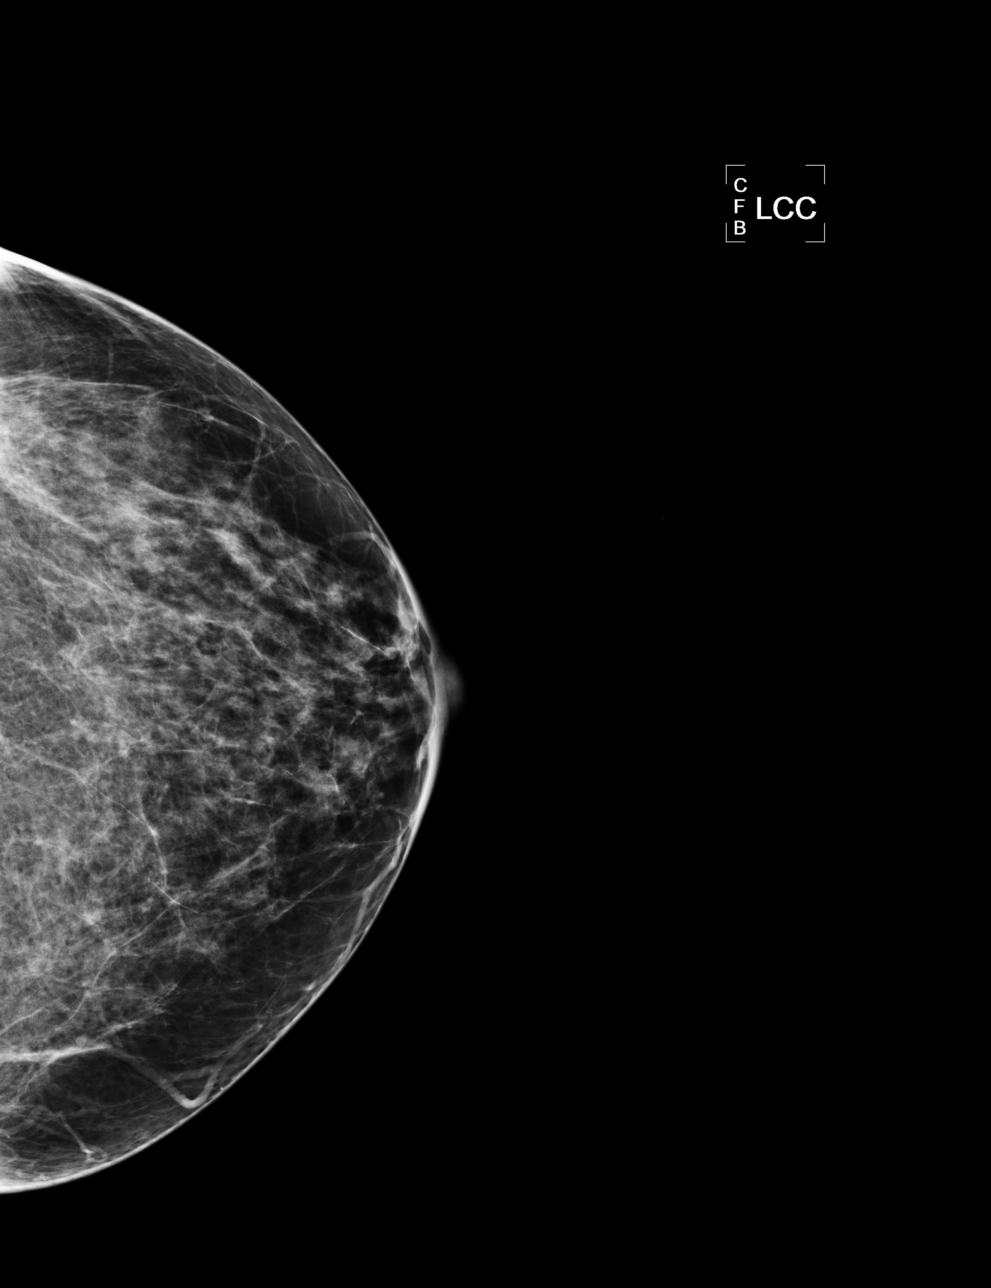

[L MLO]
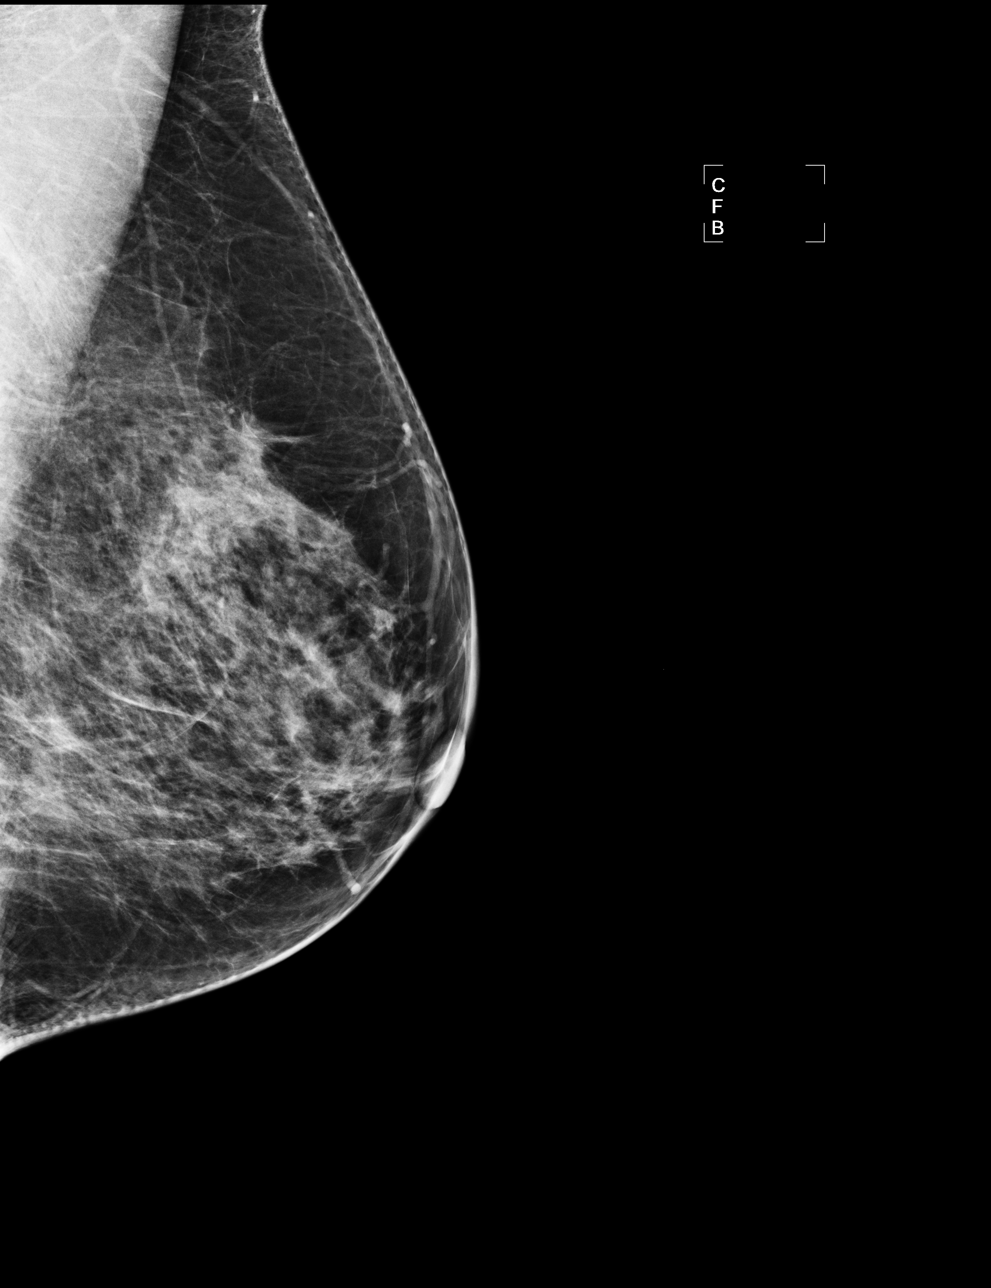

[R MLO]
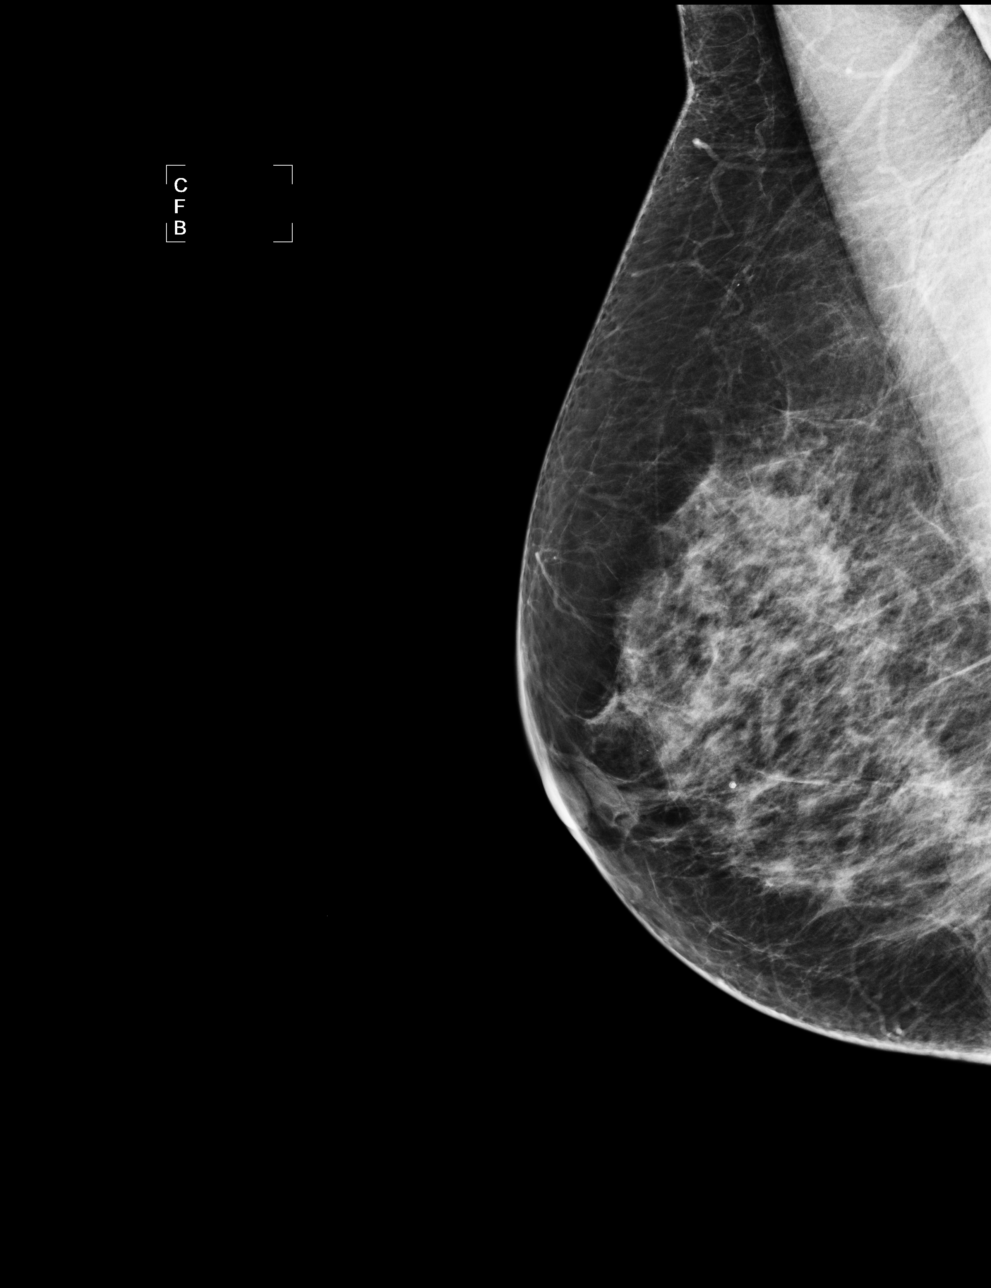

[4 of 4 positions shown; findings below may reference images not displayed]

ACR Breast Density Category c: The breast tissue is heterogeneously
dense, which may obscure small masses.
FINDINGS: There are no findings suspicious for malignancy. Images were
processed with CAD.
IMPRESSION: No mammographic evidence of malignancy. A result letter of this
screening mammogram will be mailed directly to the patient.

RECOMMENDATION:
Screening mammogram in one year. (Code:[0J])

BI-RADS CATEGORY  1: Negative.

## 2014-07-29 ENCOUNTER — Other Ambulatory Visit: Payer: Medicare Other | Admitting: Internal Medicine

## 2014-07-29 DIAGNOSIS — I10 Essential (primary) hypertension: Secondary | ICD-10-CM | POA: Diagnosis not present

## 2014-07-29 DIAGNOSIS — E039 Hypothyroidism, unspecified: Secondary | ICD-10-CM

## 2014-07-29 DIAGNOSIS — E559 Vitamin D deficiency, unspecified: Secondary | ICD-10-CM | POA: Diagnosis not present

## 2014-07-29 DIAGNOSIS — M899 Disorder of bone, unspecified: Secondary | ICD-10-CM | POA: Diagnosis not present

## 2014-07-29 DIAGNOSIS — M949 Disorder of cartilage, unspecified: Secondary | ICD-10-CM | POA: Diagnosis not present

## 2014-07-29 DIAGNOSIS — Z13220 Encounter for screening for lipoid disorders: Secondary | ICD-10-CM | POA: Diagnosis not present

## 2014-07-29 LAB — COMPREHENSIVE METABOLIC PANEL
ALK PHOS: 40 U/L (ref 39–117)
ALT: 12 U/L (ref 0–35)
AST: 17 U/L (ref 0–37)
Albumin: 4.1 g/dL (ref 3.5–5.2)
BILIRUBIN TOTAL: 0.7 mg/dL (ref 0.2–1.2)
BUN: 14 mg/dL (ref 6–23)
CO2: 26 mEq/L (ref 19–32)
Calcium: 9 mg/dL (ref 8.4–10.5)
Chloride: 105 mEq/L (ref 96–112)
Creat: 0.71 mg/dL (ref 0.50–1.10)
GLUCOSE: 111 mg/dL — AB (ref 70–99)
Potassium: 4.5 mEq/L (ref 3.5–5.3)
SODIUM: 140 meq/L (ref 135–145)
TOTAL PROTEIN: 6.2 g/dL (ref 6.0–8.3)

## 2014-07-29 LAB — CBC WITH DIFFERENTIAL/PLATELET
BASOS PCT: 0 % (ref 0–1)
Basophils Absolute: 0 10*3/uL (ref 0.0–0.1)
EOS ABS: 0.1 10*3/uL (ref 0.0–0.7)
Eosinophils Relative: 3 % (ref 0–5)
HCT: 38.2 % (ref 36.0–46.0)
HEMOGLOBIN: 12.9 g/dL (ref 12.0–15.0)
Lymphocytes Relative: 32 % (ref 12–46)
Lymphs Abs: 1.5 10*3/uL (ref 0.7–4.0)
MCH: 31.1 pg (ref 26.0–34.0)
MCHC: 33.8 g/dL (ref 30.0–36.0)
MCV: 92 fL (ref 78.0–100.0)
MONOS PCT: 9 % (ref 3–12)
Monocytes Absolute: 0.4 10*3/uL (ref 0.1–1.0)
NEUTROS PCT: 56 % (ref 43–77)
Neutro Abs: 2.7 10*3/uL (ref 1.7–7.7)
Platelets: 210 10*3/uL (ref 150–400)
RBC: 4.15 MIL/uL (ref 3.87–5.11)
RDW: 14.1 % (ref 11.5–15.5)
WBC: 4.8 10*3/uL (ref 4.0–10.5)

## 2014-07-29 LAB — LIPID PANEL
Cholesterol: 200 mg/dL (ref 0–200)
HDL: 80 mg/dL (ref >39)
LDL CALC: 103 mg/dL — AB (ref 0–99)
TRIGLYCERIDES: 83 mg/dL (ref <150)
Total CHOL/HDL Ratio: 2.5 Ratio
VLDL: 17 mg/dL (ref 0–40)

## 2014-07-29 LAB — TSH: TSH: 1.571 u[IU]/mL (ref 0.350–4.500)

## 2014-07-30 LAB — VITAMIN D 25 HYDROXY (VIT D DEFICIENCY, FRACTURES): Vit D, 25-Hydroxy: 44 ng/mL (ref 30–89)

## 2014-07-31 ENCOUNTER — Other Ambulatory Visit (HOSPITAL_COMMUNITY)
Admission: RE | Admit: 2014-07-31 | Discharge: 2014-07-31 | Disposition: A | Discharge status: Home / Self Care | Payer: Medicare Other | Source: Ambulatory Visit | Attending: Internal Medicine | Admitting: Internal Medicine

## 2014-07-31 ENCOUNTER — Ambulatory Visit (INDEPENDENT_AMBULATORY_CARE_PROVIDER_SITE_OTHER): Payer: Medicare Other | Admitting: Internal Medicine

## 2014-07-31 ENCOUNTER — Encounter: Payer: Self-pay | Admitting: Internal Medicine

## 2014-07-31 VITALS — BP 130/82 | HR 72 | Ht 65.5 in | Wt 134.0 lb

## 2014-07-31 DIAGNOSIS — Z8639 Personal history of other endocrine, nutritional and metabolic disease: Secondary | ICD-10-CM

## 2014-07-31 DIAGNOSIS — R7309 Other abnormal glucose: Secondary | ICD-10-CM

## 2014-07-31 DIAGNOSIS — M899 Disorder of bone, unspecified: Secondary | ICD-10-CM

## 2014-07-31 DIAGNOSIS — Z124 Encounter for screening for malignant neoplasm of cervix: Secondary | ICD-10-CM | POA: Insufficient documentation

## 2014-07-31 DIAGNOSIS — I10 Essential (primary) hypertension: Secondary | ICD-10-CM

## 2014-07-31 DIAGNOSIS — Z0000 Encounter for general adult medical examination without abnormal findings: Secondary | ICD-10-CM | POA: Diagnosis not present

## 2014-07-31 DIAGNOSIS — M8580 Other specified disorders of bone density and structure, unspecified site: Secondary | ICD-10-CM

## 2014-07-31 DIAGNOSIS — R739 Hyperglycemia, unspecified: Secondary | ICD-10-CM

## 2014-07-31 DIAGNOSIS — M949 Disorder of cartilage, unspecified: Secondary | ICD-10-CM

## 2014-07-31 DIAGNOSIS — Z23 Encounter for immunization: Secondary | ICD-10-CM

## 2014-07-31 DIAGNOSIS — E039 Hypothyroidism, unspecified: Secondary | ICD-10-CM | POA: Diagnosis not present

## 2014-07-31 LAB — POCT URINALYSIS DIPSTICK
Bilirubin, UA: NEGATIVE
Glucose, UA: NEGATIVE
KETONES UA: NEGATIVE
Leukocytes, UA: NEGATIVE
Nitrite, UA: NEGATIVE
PH UA: 5
PROTEIN UA: NEGATIVE
RBC UA: NEGATIVE
SPEC GRAV UA: 1.01
Urobilinogen, UA: NEGATIVE

## 2014-07-31 NOTE — Progress Notes (Signed)
Subjective:    Patient ID: Amanda Potts, female    DOB: 10-Feb-1946, 68 y.o.   MRN: [id number]  HPI  68 year old white Female in today for health maintenance and evaluation of medical issues including hypertension, osteopenia, hypothyroidism and history of vitamin D deficiency.   Past medical history: Bilateral cataract extractions. Colonoscopy done in 2009. Tried Actonel for osteopenia but it caused myalgias. She tried Fosamax but it caused heartburn. At this point we are just treating her with calcium and vitamin D.  She is allergic to Sulfa.  Social history: Married. 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 Nonsmoker. Social alcohol consumption. 3 adult children. She has one set of twins, a son and a daughter who are in their 50s. Her daughter resides in [place]. Oldest son lives here in [place]. Has several grandchildren.  Family history: Brother living in good health. He is in his late 43s. Father died at age 35 with dementia and stroke. Mother died at age 74 with COPD and heart failure. One brother died at age 48 with lung cancer and was a smoker.    Review of Systems  Constitutional: Negative.   All other systems reviewed and are negative.      Objective:   Physical Exam  Constitutional: She is oriented to person, place, and time. She appears well-developed and well-nourished. No distress.  HENT:  Head: Normocephalic and atraumatic.  Right Ear: External ear normal.  Left Ear: External ear normal.  Mouth/Throat: Oropharynx is clear and moist. No oropharyngeal exudate.  Eyes: Conjunctivae and EOM are normal. Pupils are equal, round, and reactive to light. Right eye exhibits no discharge. Left eye exhibits no discharge. No scleral icterus.  Neck: Neck supple. No JVD present. No thyromegaly present.  Cardiovascular: Normal rate, regular rhythm and normal heart sounds.   No  murmur heard. Breasts normal female  Pulmonary/Chest: Effort normal and breath sounds normal. No respiratory distress. She has no wheezes. She has no rales.  Abdominal: Soft. Bowel sounds are normal. She exhibits no distension and no mass. There is no tenderness. There is no rebound and no guarding.  Genitourinary:  Pap taken. Bimanual normal.  Musculoskeletal: Normal range of motion. She exhibits no edema.  Lymphadenopathy:    She has no cervical adenopathy.  Neurological: She is alert and oriented to person, place, and time. She has normal reflexes. No cranial nerve deficit. Coordination normal.  Skin: Skin is warm and dry. No rash noted. She is not diaphoretic.  Psychiatric: She has a normal mood and affect. Her behavior is normal. Judgment and thought content normal.  Vitals reviewed.         Assessment & Plan:  Hypothyroidism-stable on thyroid replacement. Continue current regimen and return in 6 months  Elevated serum glucose at 111. Watch diet and exercise and recheck in 6 months  History of vitamin D deficiency  History of mild osteopenia  Hypertension-stable on metoprolol  History of colon polyps. Colonoscopy done in 2009. No polyps noted on that report the patient indicates she had colon polyps on previous study.  Plan: Return in 6 months for office visit TSH and fasting serum glucose with hemoglobin A1c.   Subjective:   Patient presents for Medicare Annual/Subsequent preventive examination.  Review Past Medical/Family/Social: See above   Risk Factors  Current exercise habits:  Dietary issues discussed:   Cardiac risk factors: Hypertension, family history of stroke in  father. Heart failure in mother.  Depression Screen  (Note: if answer to either of the following is "Yes", a more complete depression screening is indicated)   Over the past two weeks, have you felt down, depressed or hopeless? No  Over the past two weeks, have you felt little interest or  pleasure in doing things? No Have you lost interest or pleasure in daily life? No Do you often feel hopeless? No Do you cry easily over simple problems? No   Activities of Daily Living  In your present state of health, do you have any difficulty performing the following activities?:   Driving? No  Managing money? No  Feeding yourself? No  Getting from bed to chair? No  Climbing a flight of stairs? No  Preparing food and eating?: No  Bathing or showering? No  Getting dressed: No  Getting to the toilet? No  Using the toilet:No  Moving around from place to place: No  In the past year have you fallen or had a near fall?:No  Are you sexually active? No  Do you have more than one partner? No   Hearing Difficulties: No  Do you often ask people to speak up or repeat themselves? No  Do you experience ringing or noises in your ears? No  Do you have difficulty understanding soft or whispered voices? No  Do you feel that you have a problem with memory? No Do you often misplace items? No    Home Safety:  Do you have a smoke alarm at your residence? Yes Do you have grab bars in the bathroom? Yes Do you have throw rugs in your house? Yes   Cognitive Testing  Alert? Yes Normal Appearance?Yes  Oriented to person? Yes Place? Yes  Time? Yes  Recall of three objects? Yes  Can perform simple calculations? Yes  Displays appropriate judgment?Yes  Can read the correct time from a watch face?Yes   List the Names of Other Physician/Practitioners you currently use:  See referral list for the physicians patient is currently seeing.   Eye physician is in [place], Dr. Shonna Chock  Review of Systems: See above   Objective:     General appearance: Appears stated age  Head: Normocephalic, without obvious abnormality, atraumatic  Eyes: conj clear, EOMi PEERLA  Ears: normal TM's and external ear canals both ears  Nose: Nares normal. Septum midline. Mucosa normal. No drainage or sinus  tenderness.  Throat: lips, mucosa, and tongue normal; teeth and gums normal  Neck: no adenopathy, no carotid bruit, no JVD, supple, symmetrical, trachea midline and thyroid not enlarged, symmetric, no tenderness/mass/nodules  No CVA tenderness.  Lungs: clear to auscultation bilaterally  Breasts: normal appearance, no masses or tenderness Heart: regular rate and rhythm, S1, S2 normal, no murmur, click, rub or gallop  Abdomen: soft, non-tender; bowel sounds normal; no masses, no organomegaly  Musculoskeletal: ROM normal in all joints, no crepitus, no deformity, Normal muscle strengthen. Back  is symmetric, no curvature. Skin: Skin color, texture, turgor normal. No rashes or lesions  Lymph nodes: Cervical, supraclavicular, and axillary nodes normal.  Neurologic: CN 2 -12 Normal, Normal symmetric reflexes. Normal coordination and gait  Psych: Alert & Oriented x 3, Mood appear stable.    Assessment:    Annual wellness medicare exam   Plan:    During the course of the visit the patient was educated and counseled about appropriate screening and preventive services including:   Annual mammogram  Bone density every 2-3 years  Patient Instructions (the written plan) was given to the patient.  Medicare Attestation  I have personally reviewed:  The patient's medical and social history  Their use of alcohol, tobacco or illicit drugs  Their current medications and supplements  The patient's functional ability including ADLs,fall risks, home safety risks, cognitive, and hearing and visual impairment  Diet and physical activities  Evidence for depression or mood disorders  The patient's weight, height, BMI, and visual acuity have been recorded in the chart. I have made referrals, counseling, and provided education to the patient based on review of the above and I have provided the patient with a written personalized care plan for preventive services.

## 2014-08-04 LAB — CYTOLOGY - PAP

## 2014-10-03 ENCOUNTER — Other Ambulatory Visit: Payer: Self-pay | Admitting: Internal Medicine

## 2014-10-12 NOTE — Addendum Note (Signed)
Addended by: Elby Showers on: 10/12/2014 03:47 PM   Modules accepted: Level of Service

## 2014-10-12 NOTE — Addendum Note (Signed)
Addended by: Elby Showers on: 10/12/2014 03:49 PM   Modules accepted: Level of Service

## 2014-10-12 NOTE — Patient Instructions (Signed)
Continue same medications and return in 6 months. Watch diet and exercise. Glucose needs to be rechecked in 6 months.

## 2014-10-12 NOTE — Addendum Note (Signed)
Addended by: Elby Showers on: 10/12/2014 03:43 PM   Modules accepted: Level of Service

## 2014-11-01 ENCOUNTER — Other Ambulatory Visit: Payer: Self-pay | Admitting: Internal Medicine

## 2014-11-27 ENCOUNTER — Other Ambulatory Visit: Payer: Self-pay | Admitting: *Deleted

## 2014-11-27 MED ORDER — SYNTHROID 75 MCG PO TABS: 75.0000 ug | ORAL_TABLET | Freq: Every day | ORAL | Status: DC

## 2015-06-26 ENCOUNTER — Other Ambulatory Visit: Payer: Self-pay

## 2015-06-26 DIAGNOSIS — Z1231 Encounter for screening mammogram for malignant neoplasm of breast: Secondary | ICD-10-CM

## 2015-07-03 ENCOUNTER — Ambulatory Visit: Payer: Medicare Other

## 2015-07-28 ENCOUNTER — Ambulatory Visit
Admission: RE | Admit: 2015-07-28 | Discharge: 2015-07-28 | Disposition: A | Discharge status: Home / Self Care | Payer: Medicare Other | Source: Ambulatory Visit

## 2015-07-28 DIAGNOSIS — Z1231 Encounter for screening mammogram for malignant neoplasm of breast: Secondary | ICD-10-CM

## 2015-08-03 ENCOUNTER — Other Ambulatory Visit: Payer: Medicare Other | Admitting: Internal Medicine

## 2015-08-03 DIAGNOSIS — Z13220 Encounter for screening for lipoid disorders: Secondary | ICD-10-CM | POA: Diagnosis not present

## 2015-08-03 DIAGNOSIS — Z79899 Other long term (current) drug therapy: Secondary | ICD-10-CM | POA: Diagnosis not present

## 2015-08-03 DIAGNOSIS — E039 Hypothyroidism, unspecified: Secondary | ICD-10-CM

## 2015-08-03 DIAGNOSIS — E559 Vitamin D deficiency, unspecified: Secondary | ICD-10-CM | POA: Diagnosis not present

## 2015-08-03 DIAGNOSIS — Z136 Encounter for screening for cardiovascular disorders: Secondary | ICD-10-CM | POA: Diagnosis not present

## 2015-08-03 DIAGNOSIS — R5383 Other fatigue: Secondary | ICD-10-CM | POA: Diagnosis not present

## 2015-08-03 LAB — COMPLETE METABOLIC PANEL WITH GFR
ALBUMIN: 3.9 g/dL (ref 3.6–5.1)
ALK PHOS: 41 U/L (ref 33–130)
ALT: 10 U/L (ref 6–29)
AST: 16 U/L (ref 10–35)
BUN: 17 mg/dL (ref 7–25)
CHLORIDE: 101 mmol/L (ref 98–110)
CO2: 27 mmol/L (ref 20–31)
CREATININE: 0.68 mg/dL (ref 0.50–0.99)
Calcium: 8.8 mg/dL (ref 8.6–10.4)
GFR, Est African American: >89 mL/min (ref >=60)
GFR, Est Non African American: >89 mL/min (ref >=60)
GLUCOSE: 100 mg/dL — AB (ref 65–99)
POTASSIUM: 4.6 mmol/L (ref 3.5–5.3)
SODIUM: 137 mmol/L (ref 135–146)
Total Bilirubin: 0.5 mg/dL (ref 0.2–1.2)
Total Protein: 6 g/dL — ABNORMAL LOW (ref 6.1–8.1)

## 2015-08-03 LAB — LIPID PANEL
CHOLESTEROL: 191 mg/dL (ref 125–200)
HDL: 69 mg/dL (ref >=46)
LDL Cholesterol: 109 mg/dL (ref <130)
TRIGLYCERIDES: 63 mg/dL (ref <150)
Total CHOL/HDL Ratio: 2.8 Ratio (ref <=5.0)
VLDL: 13 mg/dL (ref <30)

## 2015-08-03 LAB — CBC WITH DIFFERENTIAL/PLATELET
BASOS ABS: 0.1 10*3/uL (ref 0.0–0.1)
BASOS PCT: 1 % (ref 0–1)
Eosinophils Absolute: 0.2 10*3/uL (ref 0.0–0.7)
Eosinophils Relative: 3 % (ref 0–5)
HCT: 40.1 % (ref 36.0–46.0)
HEMOGLOBIN: 13 g/dL (ref 12.0–15.0)
LYMPHS PCT: 30 % (ref 12–46)
Lymphs Abs: 1.7 10*3/uL (ref 0.7–4.0)
MCH: 30.7 pg (ref 26.0–34.0)
MCHC: 32.4 g/dL (ref 30.0–36.0)
MCV: 94.6 fL (ref 78.0–100.0)
MONO ABS: 0.6 10*3/uL (ref 0.1–1.0)
MPV: 10.6 fL (ref 8.6–12.4)
Monocytes Relative: 10 % (ref 3–12)
NEUTROS ABS: 3.2 10*3/uL (ref 1.7–7.7)
NEUTROS PCT: 56 % (ref 43–77)
Platelets: 212 10*3/uL (ref 150–400)
RBC: 4.24 MIL/uL (ref 3.87–5.11)
RDW: 13.5 % (ref 11.5–15.5)
WBC: 5.7 10*3/uL (ref 4.0–10.5)

## 2015-08-03 LAB — TSH: TSH: 1.348 u[IU]/mL (ref 0.350–4.500)

## 2015-08-04 ENCOUNTER — Encounter: Payer: Self-pay | Admitting: Internal Medicine

## 2015-08-04 ENCOUNTER — Ambulatory Visit (INDEPENDENT_AMBULATORY_CARE_PROVIDER_SITE_OTHER): Payer: Medicare Other | Admitting: Internal Medicine

## 2015-08-04 VITALS — BP 116/84 | HR 65 | Temp 97.9°F | Ht 66.0 in | Wt 135.0 lb

## 2015-08-04 DIAGNOSIS — Z0000 Encounter for general adult medical examination without abnormal findings: Secondary | ICD-10-CM | POA: Diagnosis not present

## 2015-08-04 DIAGNOSIS — R739 Hyperglycemia, unspecified: Secondary | ICD-10-CM

## 2015-08-04 DIAGNOSIS — M8580 Other specified disorders of bone density and structure, unspecified site: Secondary | ICD-10-CM | POA: Diagnosis not present

## 2015-08-04 DIAGNOSIS — I10 Essential (primary) hypertension: Secondary | ICD-10-CM | POA: Diagnosis not present

## 2015-08-04 DIAGNOSIS — Z8639 Personal history of other endocrine, nutritional and metabolic disease: Secondary | ICD-10-CM | POA: Diagnosis not present

## 2015-08-04 DIAGNOSIS — E039 Hypothyroidism, unspecified: Secondary | ICD-10-CM | POA: Diagnosis not present

## 2015-08-04 LAB — POCT URINALYSIS DIPSTICK
Bilirubin, UA: NEGATIVE
Glucose, UA: NEGATIVE
Ketones, UA: NEGATIVE
Leukocytes, UA: NEGATIVE
Nitrite, UA: NEGATIVE
PROTEIN UA: NEGATIVE
RBC UA: NEGATIVE
SPEC GRAV UA: 1.01
UROBILINOGEN UA: NEGATIVE
pH, UA: 7

## 2015-08-04 LAB — VITAMIN D 25 HYDROXY (VIT D DEFICIENCY, FRACTURES): VIT D 25 HYDROXY: 33 ng/mL (ref 30–100)

## 2015-08-05 NOTE — Progress Notes (Signed)
Subjective:    Patient ID: Amanda Potts, female    DOB: 04/22/1946, 69 y.o.   MRN: [id number]  HPI 69 year old White Female in today for health maintenance exam and evaluation of medical problems. Patient comes once yearly. She has essential hypertension and hypothyroidism. History of osteopenia. History of vitamin D deficiency. History of elevated serum glucose.  Past medical history: Bilateral cataract extractions, colonoscopy done in 2009. She tried Actonel for osteopenia but it caused myalgias. She tried Fosamax but it caused heartburn. At this point where treating her osteopenia just with calcium and vitamin D.  She is allergic to sulfa  Social history: Married. 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 Nonsmoker. Social alcohol consumption. 3 adult children. She has one set of twins, a son and a daughter who are in their 76s. Daughter resides in [place]. Oldest son lives here in [place]. Has several grandchildren.  Family history: Brother living in good health in his late 32s. Father died at age 2 with dementia and stroke. Mother died at age 86 with COPD and heart failure. One brother died at age 11 with lung cancer and was a smoker.    Review of Systems  Constitutional: Negative.   Respiratory: Negative.   Cardiovascular: Negative.   Genitourinary: Negative.   Neurological: Negative.   Psychiatric/Behavioral: Negative.        Objective:   Physical Exam  Constitutional: She is oriented to person, place, and time. She appears well-developed and well-nourished. No distress.  HENT:  Head: Normocephalic and atraumatic.  Right Ear: External ear normal.  Left Ear: External ear normal.  Mouth/Throat: Oropharynx is clear and moist. No oropharyngeal exudate.  Eyes: Conjunctivae and EOM are normal. Pupils are equal, round, and reactive to light. Right eye exhibits no discharge.  Left eye exhibits no discharge. No scleral icterus.  Neck: Neck supple. No JVD present. No thyromegaly present.  Cardiovascular: Normal rate, regular rhythm and intact distal pulses.   No murmur heard. Pulmonary/Chest: Effort normal. No respiratory distress. She has no rales. She exhibits no tenderness.  Breasts normal female without mass  Abdominal: Soft. Bowel sounds are normal. She exhibits no distension. There is no tenderness. There is no rebound and no guarding.  Genitourinary:  Pap taken in 2015 and will not be repeated due to age. Bimanual normal.  Musculoskeletal: She exhibits no edema.  Lymphadenopathy:    She has no cervical adenopathy.  Neurological: She is alert and oriented to person, place, and time. She has normal reflexes. No cranial nerve deficit. Coordination normal.  Skin: Skin is warm and dry. No rash noted. She is not diaphoretic.  Psychiatric: She has a normal mood and affect. Her behavior is normal. Judgment and thought content normal.  Vitals reviewed.         Assessment & Plan:  Normal health maintenance exam  Hypothyroidism-stable on thyroid replacement  Hypertension-stable on current regimen  Osteopenia-treated with vitamin D and calcium with history of intolerances to Fosamax and Actonel.  History of vitamin D deficiency-vitamin D level normal  History of elevated serum glucose-recent fasting glucose was 100 and previously was 111 last year  Plan: Continue same medications and return in one year.  Subjective:   Patient presents for Medicare Annual/Subsequent preventive examination.  Review Past Medical/Family/Social:   Risk Factors  Current exercise habits:  Dietary issues discussed:   Cardiac risk factors:  Depression Screen  (Note:  if answer to either of the following is "Yes", a more complete depression screening is indicated)   Over the past two weeks, have you felt down, depressed or hopeless? No  Over the past two weeks, have you  felt little interest or pleasure in doing things? No Have you lost interest or pleasure in daily life? No Do you often feel hopeless? No Do you cry easily over simple problems? No   Activities of Daily Living  In your present state of health, do you have any difficulty performing the following activities?:   Driving? No  Managing money? No  Feeding yourself? No  Getting from bed to chair? No  Climbing a flight of stairs? No  Preparing food and eating?: No  Bathing or showering? No  Getting dressed: No  Getting to the toilet? No  Using the toilet:No  Moving around from place to place: No  In the past year have you fallen or had a near fall?:No  Are you sexually active? No  Do you have more than one partner? No   Hearing Difficulties: No  Do you often ask people to speak up or repeat themselves? Sometimes Do you experience ringing or noises in your ears? No  Do you have difficulty understanding soft or whispered voices? Yes Do you feel that you have a problem with memory? No Do you often misplace items? No    Home Safety:  Do you have a smoke alarm at your residence? Yes Do you have grab bars in the bathroom? yes Do you have throw rugs in your house? No   Cognitive Testing  Alert? Yes Normal Appearance?Yes  Oriented to person? Yes Place? Yes  Time? Yes  Recall of three objects? Yes  Can perform simple calculations? Yes  Displays appropriate judgment?Yes  Can read the correct time from a watch face?Yes   List the Names of Other Physician/Practitioners you currently use:  See referral list for the physicians patient is currently seeing.     Review of Systems: See above  Objective:     General appearance: Appears stated age  Head: Normocephalic, without obvious abnormality, atraumatic  Eyes: conj clear, EOMi PEERLA  Ears: normal TM's and external ear canals both ears  Nose: Nares normal. Septum midline. Mucosa normal. No drainage or sinus tenderness.  Throat:  lips, mucosa, and tongue normal; teeth and gums normal  Neck: no adenopathy, no carotid bruit, no JVD, supple, symmetrical, trachea midline and thyroid not enlarged, symmetric, no tenderness/mass/nodules  No CVA tenderness.  Lungs: clear to auscultation bilaterally  Breasts: normal appearance, no masses or tenderness Heart: regular rate and rhythm, S1, S2 normal, no murmur, click, rub or gallop  Abdomen: soft, non-tender; bowel sounds normal; no masses, no organomegaly  Musculoskeletal: ROM normal in all joints, no crepitus, no deformity, Normal muscle strengthen. Back  is symmetric, no curvature. Skin: Skin color, texture, turgor normal. No rashes or lesions  Lymph nodes: Cervical, supraclavicular, and axillary nodes normal.  Neurologic: CN 2 -12 Normal, Normal symmetric reflexes. Normal coordination and gait  Psych: Alert & Oriented x 3, Mood appear stable.    Assessment:    Annual wellness medicare exam   Plan:    During the course of the visit the patient was educated and counseled about appropriate screening and preventive services including:   Annual mammogram  Bone density study every 2-3 years     Patient Instructions (the written plan) was given to the patient.  Medicare Attestation  I have personally  reviewed:  The patient's medical and social history  Their use of alcohol, tobacco or illicit drugs  Their current medications and supplements  The patient's functional ability including ADLs,fall risks, home safety risks, cognitive, and hearing and visual impairment  Diet and physical activities  Evidence for depression or mood disorders  The patient's weight, height, BMI, and visual acuity have been recorded in the chart. I have made referrals, counseling, and provided education to the patient based on review of the above and I have provided the patient with a written personalized care plan for preventive services.

## 2015-09-17 ENCOUNTER — Ambulatory Visit: Payer: Self-pay | Admitting: Internal Medicine

## 2015-09-17 ENCOUNTER — Telehealth: Payer: Self-pay

## 2015-09-17 NOTE — Telephone Encounter (Signed)
Spoke with patient appointment made for Prevnar vaccine.

## 2015-09-21 ENCOUNTER — Encounter: Payer: Self-pay | Admitting: Internal Medicine

## 2015-09-21 ENCOUNTER — Ambulatory Visit (INDEPENDENT_AMBULATORY_CARE_PROVIDER_SITE_OTHER): Payer: Medicare Other | Admitting: Internal Medicine

## 2015-09-21 VITALS — HR 65 | Temp 97.6°F | Wt 135.0 lb

## 2015-09-21 DIAGNOSIS — Z23 Encounter for immunization: Secondary | ICD-10-CM

## 2015-09-21 NOTE — Progress Notes (Signed)
Patient ID: Amanda Potts, female   DOB: 1946-03-29, 69 y.o.   MRN: [id number] Patient seen today for Prevnar 13 vaccine.  Patient tolerated well, (history of fainting). Patient declines flu vaccine.

## 2015-11-11 ENCOUNTER — Other Ambulatory Visit: Payer: Self-pay | Admitting: Internal Medicine

## 2015-11-19 NOTE — Patient Instructions (Signed)
Continue same medications and return in one year. It was a pleasure to see you today. 

## 2015-11-26 ENCOUNTER — Other Ambulatory Visit: Payer: Self-pay | Admitting: Internal Medicine

## 2016-01-26 ENCOUNTER — Telehealth: Payer: Self-pay | Admitting: Internal Medicine

## 2016-01-26 NOTE — Telephone Encounter (Signed)
Patient states that she's having pain in the thigh area down to her knee.  States that the pain is at rest and she doesn't have the pain when she gets moving.  For example, she can squat without pain, but when she attempts to bend over, she has pain in the thigh to knee area.  Some days she'll have no pain at all, other days, it's very painful.  Before I booked her appointment I wanted to see if you wanted to evaluate her or send her to ortho doc.  Please advise.  Thank you.

## 2016-01-26 NOTE — Telephone Encounter (Signed)
Please refer to Ortho. May try [facility].

## 2016-01-28 NOTE — Telephone Encounter (Signed)
Faxed referral/documentation over to [employer].  Spoke with patient and advised that she should receive a call from [facility] with an appointment.

## 2016-01-29 ENCOUNTER — Telehealth: Payer: Self-pay | Admitting: Internal Medicine

## 2016-01-29 NOTE — Telephone Encounter (Signed)
Received fax [facility] @ [place] stating that appointment has been set up for patient with Dr. Vickki Hearing for 02/01/16 @ 1:45 p.m.    Spoke with patient and confirmed that she has been notified of this appointment.

## 2016-02-01 DIAGNOSIS — M25551 Pain in right hip: Secondary | ICD-10-CM | POA: Diagnosis not present

## 2016-08-25 ENCOUNTER — Other Ambulatory Visit: Payer: Self-pay | Admitting: Internal Medicine

## 2016-08-25 DIAGNOSIS — Z1231 Encounter for screening mammogram for malignant neoplasm of breast: Secondary | ICD-10-CM

## 2016-09-01 ENCOUNTER — Ambulatory Visit
Admission: RE | Admit: 2016-09-01 | Discharge: 2016-09-01 | Disposition: A | Discharge status: Home / Self Care | Payer: Medicare Other | Source: Ambulatory Visit | Attending: Internal Medicine | Admitting: Internal Medicine

## 2016-09-01 DIAGNOSIS — Z1231 Encounter for screening mammogram for malignant neoplasm of breast: Secondary | ICD-10-CM

## 2016-10-07 ENCOUNTER — Other Ambulatory Visit: Payer: Self-pay | Admitting: Internal Medicine

## 2016-10-07 ENCOUNTER — Other Ambulatory Visit: Payer: Medicare Other | Admitting: Internal Medicine

## 2016-10-07 DIAGNOSIS — I10 Essential (primary) hypertension: Secondary | ICD-10-CM

## 2016-10-07 DIAGNOSIS — E039 Hypothyroidism, unspecified: Secondary | ICD-10-CM

## 2016-10-07 DIAGNOSIS — Z0000 Encounter for general adult medical examination without abnormal findings: Secondary | ICD-10-CM

## 2016-10-07 DIAGNOSIS — E559 Vitamin D deficiency, unspecified: Secondary | ICD-10-CM

## 2016-10-07 LAB — COMPLETE METABOLIC PANEL WITH GFR
ALBUMIN: 4.2 g/dL (ref 3.6–5.1)
ALK PHOS: 42 U/L (ref 33–130)
ALT: 11 U/L (ref 6–29)
AST: 17 U/L (ref 10–35)
BILIRUBIN TOTAL: 0.6 mg/dL (ref 0.2–1.2)
BUN: 16 mg/dL (ref 7–25)
CALCIUM: 9.2 mg/dL (ref 8.6–10.4)
CO2: 27 mmol/L (ref 20–31)
Chloride: 103 mmol/L (ref 98–110)
Creat: 0.69 mg/dL (ref 0.60–0.93)
GFR, EST NON AFRICAN AMERICAN: 88 mL/min (ref >=60)
GFR, Est African American: >89 mL/min (ref >=60)
GLUCOSE: 107 mg/dL — AB (ref 65–99)
POTASSIUM: 4.6 mmol/L (ref 3.5–5.3)
SODIUM: 137 mmol/L (ref 135–146)
TOTAL PROTEIN: 6.4 g/dL (ref 6.1–8.1)

## 2016-10-07 LAB — CBC WITH DIFFERENTIAL/PLATELET
BASOS ABS: 0 {cells}/uL (ref 0–200)
Basophils Relative: 0 %
EOS ABS: 135 {cells}/uL (ref 15–500)
EOS PCT: 3 %
HEMATOCRIT: 40.6 % (ref 35.0–45.0)
HEMOGLOBIN: 13.4 g/dL (ref 11.7–15.5)
LYMPHS ABS: 1620 {cells}/uL (ref 850–3900)
Lymphocytes Relative: 36 %
MCH: 30.9 pg (ref 27.0–33.0)
MCHC: 33 g/dL (ref 32.0–36.0)
MCV: 93.5 fL (ref 80.0–100.0)
MONO ABS: 360 {cells}/uL (ref 200–950)
MPV: 10.6 fL (ref 7.5–12.5)
Monocytes Relative: 8 %
NEUTROS ABS: 2385 {cells}/uL (ref 1500–7800)
NEUTROS PCT: 53 %
Platelets: 243 10*3/uL (ref 140–400)
RBC: 4.34 MIL/uL (ref 3.80–5.10)
RDW: 13.5 % (ref 11.0–15.0)
WBC: 4.5 10*3/uL (ref 3.8–10.8)

## 2016-10-07 LAB — TSH: TSH: 1.42 m[IU]/L

## 2016-10-07 LAB — LIPID PANEL
Cholesterol: 193 mg/dL (ref <200)
HDL: 84 mg/dL (ref >50)
LDL CALC: 94 mg/dL (ref <100)
TRIGLYCERIDES: 76 mg/dL (ref <150)
Total CHOL/HDL Ratio: 2.3 Ratio (ref <5.0)
VLDL: 15 mg/dL (ref <30)

## 2016-10-08 LAB — VITAMIN D 25 HYDROXY (VIT D DEFICIENCY, FRACTURES): VIT D 25 HYDROXY: 27 ng/mL — AB (ref 30–100)

## 2016-10-10 ENCOUNTER — Ambulatory Visit (INDEPENDENT_AMBULATORY_CARE_PROVIDER_SITE_OTHER): Payer: Medicare Other | Admitting: Internal Medicine

## 2016-10-10 ENCOUNTER — Encounter: Payer: Self-pay | Admitting: Internal Medicine

## 2016-10-10 ENCOUNTER — Telehealth: Payer: Self-pay | Admitting: Internal Medicine

## 2016-10-10 VITALS — BP 132/80 | HR 76 | Temp 97.8°F | Ht 65.25 in | Wt 140.0 lb

## 2016-10-10 DIAGNOSIS — Z0000 Encounter for general adult medical examination without abnormal findings: Secondary | ICD-10-CM | POA: Diagnosis not present

## 2016-10-10 DIAGNOSIS — Z8639 Personal history of other endocrine, nutritional and metabolic disease: Secondary | ICD-10-CM

## 2016-10-10 DIAGNOSIS — I10 Essential (primary) hypertension: Secondary | ICD-10-CM

## 2016-10-10 DIAGNOSIS — E039 Hypothyroidism, unspecified: Secondary | ICD-10-CM

## 2016-10-10 DIAGNOSIS — R739 Hyperglycemia, unspecified: Secondary | ICD-10-CM | POA: Diagnosis not present

## 2016-10-10 DIAGNOSIS — M8580 Other specified disorders of bone density and structure, unspecified site: Secondary | ICD-10-CM

## 2016-10-10 LAB — POCT URINALYSIS DIPSTICK
BILIRUBIN UA: NEGATIVE
Blood, UA: NEGATIVE
Glucose, UA: NEGATIVE
KETONES UA: NEGATIVE
LEUKOCYTES UA: NEGATIVE
Nitrite, UA: NEGATIVE
Protein, UA: NEGATIVE
Spec Grav, UA: 1.015
Urobilinogen, UA: NEGATIVE
pH, UA: 6

## 2016-10-10 MED ORDER — AMCINONIDE 0.1 % EX CREA: TOPICAL_CREAM | Freq: Three times a day (TID) | CUTANEOUS | 2 refills | Status: DC

## 2016-10-10 NOTE — Progress Notes (Signed)
   Subjective:    Patient ID: Amanda Potts, female    DOB: Jun 24, 1946, 70 y.o.   MRN: [id number]  HPI 70 year old       Review of Systems     Objective:   Physical Exam        Assessment & Plan:

## 2016-10-10 NOTE — Telephone Encounter (Signed)
HGB A1C added to lab orders per Dr Renold Genta request for elevated glucose.

## 2016-10-10 NOTE — Progress Notes (Signed)
Subjective:    Patient ID: Amanda Potts, female    DOB: [birth year], 70 y.o.   MRN: [id number]  HPI 70 year old Female in today for health maintenance exam and evaluation of medical issues.  Had issues with chiggers this summer while traveling.  Declines flu vaccine.  Has steroid cream to use for chiggers.  She has a history of essential hypertension and hypothyroidism. History of osteopenia and vitamin D deficiency. History of elevated serum glucose.  She usually comes once yearly.  Past medical history: Bilateral cataract extractions, colonoscopy 2009. She tried Actonel for osteopenia but it caused myalgias. She tried Fosamax but it caused heartburn. At this point we are just treating her osteopenia with calcium and vitamin D.  She is allergic to Sulfa.  Social history: She is married. 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 Nonsmoker. Social alcohol consumption. 3 adult children. She has one set of twins, a son and a daughter who are in their 57s. Daughter resides in [place]. Oldest son lives here in [place]. Has several grandchildren.  Family history: Brother living in good health in his late 71s. Father died at age 17 with dementia and stroke. Mother died at age 93 with COPD and heart failure. One brother died at age 72 with lung cancer and was a smoker.      Review of Systems     Objective:   Physical Exam Skin warm and dry. Nodes none. Neck is supple without JVD thyromegaly or carotid bruits. TMs and pharynx are clear. Chest clear to auscultation. Cardiac exam regular rate and rhythm normal S1 and S2. Extremities without edema. Pap taken in 2015 and will not be repeated due to age. Bimanual normal. Breasts normal female without masses. No lymphadenopathy.       Assessment & Plan:  Essential hypertension-Stable  History of chigger bites-use prescription  steroid cream as needed up to 3 times daily  Osteopenia-does not tolerate bisphosphonates  History of elevated serum glucose-fasting serum glucose 107. Normal hemoglobin A1c  Hypothyroidism-TSH normal  History of vitamin D deficiency-remains below normal. His take vitamin D supplement  Plan: Continue same medications and return in one year  Subjective:   Patient presents for Medicare Annual/Subsequent preventive examination.  Review Past Medical/Family/Social:See above   Risk Factors  Current exercise habits: Stays physically active Dietary issues discussed: Low fat low carbohydrate  Cardiac risk factors:Father with stroke. Mother with heart failure.  Depression Screen  (Note: if answer to either of the following is "Yes", a more complete depression screening is indicated)   Over the past two weeks, have you felt down, depressed or hopeless? No  Over the past two weeks, have you felt little interest or pleasure in doing things? No Have you lost interest or pleasure in daily life? No Do you often feel hopeless? No Do you cry easily over simple problems? No   Activities of Daily Living  In your present state of health, do you have any difficulty performing the following activities?:   Driving? No  Managing money? No  Feeding yourself? No  Getting from bed to chair? No  Climbing a flight of stairs? No  Preparing food and eating?: No  Bathing or showering? No  Getting dressed: No  Getting to the toilet? No  Using the toilet:No  Moving around from place to place: No  In the past year have you fallen or had a  near fall?:No  Are you sexually active? No  Do you have more than one partner? No   Hearing Difficulties: No  Do you often ask people to speak up or repeat themselves? Yes Do you experience ringing or noises in your ears? No  Do you have difficulty understanding soft or whispered voices? Yes Do you feel that you have a problem with memory? No Do you often  misplace items? No    Home Safety:  Do you have a smoke alarm at your residence? Yes Do you have grab bars in the bathroom? Yes Do you have throw rugs in your house? No   Cognitive Testing  Alert? Yes Normal Appearance?Yes  Oriented to person? Yes Place? Yes  Time? Yes  Recall of three objects? Yes  Can perform simple calculations? Yes  Displays appropriate judgment?Yes  Can read the correct time from a watch face?Yes   List the Names of Other Physician/Practitioners you currently use:  See referral list for the physicians patient is currently seeing.     Review of Systems: See above   Objective:     General appearance: Appears stated age  Head: Normocephalic, without obvious abnormality, atraumatic  Eyes: conj clear, EOMi PEERLA  Ears: normal TM's and external ear canals both ears  Nose: Nares normal. Septum midline. Mucosa normal. No drainage or sinus tenderness.  Throat: lips, mucosa, and tongue normal; teeth and gums normal  Neck: no adenopathy, no carotid bruit, no JVD, supple, symmetrical, trachea midline and thyroid not enlarged, symmetric, no tenderness/mass/nodules  No CVA tenderness.  Lungs: clear to auscultation bilaterally  Breasts: normal appearance, no masses or tenderness, t Heart: regular rate and rhythm, S1, S2 normal, no murmur, click, rub or gallop  Abdomen: soft, non-tender; bowel sounds normal; no masses, no organomegaly  Musculoskeletal: ROM normal in all joints, no crepitus, no deformity, Normal muscle strengthen. Back  is symmetric, no curvature. Skin: Skin color, texture, turgor normal. No rashes or lesions  Lymph nodes: Cervical, supraclavicular, and axillary nodes normal.  Neurologic: CN 2 -12 Normal, Normal symmetric reflexes. Normal coordination and gait  Psych: Alert & Oriented x 3, Mood appear stable.    Assessment:    Annual wellness medicare exam   Plan:    During the course of the visit the patient was educated and counseled  about appropriate screening and preventive services including:  Annual mammogram  Declines flu vaccination      Patient Instructions (the written plan) was given to the patient.  Medicare Attestation  I have personally reviewed:  The patient's medical and social history  Their use of alcohol, tobacco or illicit drugs  Their current medications and supplements  The patient's functional ability including ADLs,fall risks, home safety risks, cognitive, and hearing and visual impairment  Diet and physical activities  Evidence for depression or mood disorders  The patient's weight, height, BMI, and visual acuity have been recorded in the chart. I have made referrals, counseling, and provided education to the patient based on review of the above and I have provided the patient with a written personalized care plan for preventive services.

## 2016-10-11 LAB — HEMOGLOBIN A1C
Hgb A1c MFr Bld: 5.2 % (ref <5.7)
Mean Plasma Glucose: 103 mg/dL

## 2016-10-20 NOTE — Patient Instructions (Signed)
It was pleasure to see you today. Flu vaccine is been declined. Continue same medications and return in one year.

## 2016-11-20 ENCOUNTER — Telehealth: Payer: Self-pay | Admitting: Internal Medicine

## 2016-11-20 ENCOUNTER — Encounter: Payer: Self-pay | Admitting: Internal Medicine

## 2016-11-20 MED ORDER — SYNTHROID 75 MCG PO TABS: ORAL_TABLET | ORAL | 1 refills | Status: DC

## 2016-11-20 MED ORDER — METOPROLOL SUCCINATE ER 25 MG PO TB24: 25.0000 mg | ORAL_TABLET | Freq: Every day | ORAL | 1 refills | Status: DC

## 2016-11-20 NOTE — Telephone Encounter (Signed)
Received faxed refill request on Synthroid and metoprolol for 90 days each. The fax request that was sent appeared to have originated from [place]. However when I called at pharmacy, they indicated that the original prescription request came from [facility] here in [place]. I have sent a scribe refills on these medications to that pharmacy

## 2017-05-16 DIAGNOSIS — H35361 Drusen (degenerative) of macula, right eye: Secondary | ICD-10-CM | POA: Diagnosis not present

## 2017-05-16 DIAGNOSIS — Z961 Presence of intraocular lens: Secondary | ICD-10-CM | POA: Diagnosis not present

## 2017-05-27 ENCOUNTER — Other Ambulatory Visit: Payer: Self-pay | Admitting: Internal Medicine

## 2017-05-28 NOTE — Telephone Encounter (Signed)
Needs CPE after 10/10/17. Please call and schedule before refilling through November

## 2017-05-29 NOTE — Telephone Encounter (Signed)
Unable to leave VM just rang and rang, will try again later

## 2017-08-25 ENCOUNTER — Other Ambulatory Visit: Payer: Self-pay | Admitting: Internal Medicine

## 2017-10-04 ENCOUNTER — Other Ambulatory Visit: Payer: Self-pay | Admitting: Internal Medicine

## 2017-10-04 DIAGNOSIS — Z1231 Encounter for screening mammogram for malignant neoplasm of breast: Secondary | ICD-10-CM

## 2017-10-16 ENCOUNTER — Other Ambulatory Visit (INDEPENDENT_AMBULATORY_CARE_PROVIDER_SITE_OTHER): Payer: Medicare Other | Admitting: Internal Medicine

## 2017-10-16 DIAGNOSIS — Z0000 Encounter for general adult medical examination without abnormal findings: Secondary | ICD-10-CM

## 2017-10-16 DIAGNOSIS — E039 Hypothyroidism, unspecified: Secondary | ICD-10-CM

## 2017-10-16 DIAGNOSIS — I10 Essential (primary) hypertension: Secondary | ICD-10-CM

## 2017-10-17 LAB — CBC WITH DIFFERENTIAL/PLATELET
BASOS PCT: 0.5 %
Basophils Absolute: 29 cells/uL (ref 0–200)
EOS ABS: 182 {cells}/uL (ref 15–500)
Eosinophils Relative: 3.2 %
HEMATOCRIT: 39.2 % (ref 35.0–45.0)
Hemoglobin: 12.9 g/dL (ref 11.7–15.5)
LYMPHS ABS: 1528 {cells}/uL (ref 850–3900)
MCH: 30.6 pg (ref 27.0–33.0)
MCHC: 32.9 g/dL (ref 32.0–36.0)
MCV: 92.9 fL (ref 80.0–100.0)
MPV: 11 fL (ref 7.5–12.5)
Monocytes Relative: 7.8 %
NEUTROS PCT: 61.7 %
Neutro Abs: 3517 cells/uL (ref 1500–7800)
Platelets: 218 10*3/uL (ref 140–400)
RBC: 4.22 10*6/uL (ref 3.80–5.10)
RDW: 12.6 % (ref 11.0–15.0)
TOTAL LYMPHOCYTE: 26.8 %
WBC: 5.7 10*3/uL (ref 3.8–10.8)
WBCMIX: 445 {cells}/uL (ref 200–950)

## 2017-10-17 LAB — COMPLETE METABOLIC PANEL WITH GFR
AG RATIO: 1.7 (calc) (ref 1.0–2.5)
ALT: 11 U/L (ref 6–29)
AST: 18 U/L (ref 10–35)
Albumin: 4 g/dL (ref 3.6–5.1)
Alkaline phosphatase (APISO): 48 U/L (ref 33–130)
BILIRUBIN TOTAL: 0.7 mg/dL (ref 0.2–1.2)
BUN / CREAT RATIO: 27 (calc) — AB (ref 6–22)
BUN: 15 mg/dL (ref 7–25)
CALCIUM: 8.9 mg/dL (ref 8.6–10.4)
CHLORIDE: 104 mmol/L (ref 98–110)
CO2: 31 mmol/L (ref 20–32)
Creat: 0.56 mg/dL — ABNORMAL LOW (ref 0.60–0.93)
GFR, EST AFRICAN AMERICAN: 109 mL/min/{1.73_m2} (ref >=60)
GFR, EST NON AFRICAN AMERICAN: 94 mL/min/{1.73_m2} (ref >=60)
GLOBULIN: 2.4 g/dL (ref 1.9–3.7)
Glucose, Bld: 106 mg/dL — ABNORMAL HIGH (ref 65–99)
POTASSIUM: 4.2 mmol/L (ref 3.5–5.3)
SODIUM: 141 mmol/L (ref 135–146)
TOTAL PROTEIN: 6.4 g/dL (ref 6.1–8.1)

## 2017-10-17 LAB — LIPID PANEL
Cholesterol: 237 mg/dL — ABNORMAL HIGH (ref <200)
HDL: 113 mg/dL (ref >50)
LDL Cholesterol (Calc): 105 mg/dL (calc) — ABNORMAL HIGH
NON-HDL CHOLESTEROL (CALC): 124 mg/dL (ref <130)
TRIGLYCERIDES: 95 mg/dL (ref <150)
Total CHOL/HDL Ratio: 2.1 (calc) (ref <5.0)

## 2017-10-17 LAB — TSH: TSH: 1.63 m[IU]/L (ref 0.40–4.50)

## 2017-10-20 ENCOUNTER — Ambulatory Visit (INDEPENDENT_AMBULATORY_CARE_PROVIDER_SITE_OTHER): Payer: Medicare Other | Admitting: Internal Medicine

## 2017-10-20 ENCOUNTER — Encounter: Payer: Self-pay | Admitting: Internal Medicine

## 2017-10-20 VITALS — BP 120/80 | HR 77 | Ht 66.0 in | Wt 144.0 lb

## 2017-10-20 DIAGNOSIS — I10 Essential (primary) hypertension: Secondary | ICD-10-CM | POA: Diagnosis not present

## 2017-10-20 DIAGNOSIS — Z0000 Encounter for general adult medical examination without abnormal findings: Secondary | ICD-10-CM | POA: Diagnosis not present

## 2017-10-20 DIAGNOSIS — R739 Hyperglycemia, unspecified: Secondary | ICD-10-CM

## 2017-10-20 DIAGNOSIS — M8580 Other specified disorders of bone density and structure, unspecified site: Secondary | ICD-10-CM

## 2017-10-20 DIAGNOSIS — E039 Hypothyroidism, unspecified: Secondary | ICD-10-CM

## 2017-10-20 DIAGNOSIS — Z8639 Personal history of other endocrine, nutritional and metabolic disease: Secondary | ICD-10-CM

## 2017-10-20 LAB — POCT URINALYSIS DIPSTICK
BILIRUBIN UA: NEGATIVE
Glucose, UA: NEGATIVE
Ketones, UA: NEGATIVE
Leukocytes, UA: NEGATIVE
NITRITE UA: NEGATIVE
PH UA: 7 (ref 5.0–8.0)
Protein, UA: NEGATIVE
RBC UA: NEGATIVE
Spec Grav, UA: 1.015 (ref 1.010–1.025)
UROBILINOGEN UA: 0.2 U/dL

## 2017-10-20 NOTE — Patient Instructions (Signed)
It was a pleasure to see you today.  Watch cholesterol consumption.  Follow-up in 1 year or as needed.

## 2017-10-20 NOTE — Progress Notes (Signed)
Subjective:    Patient ID: Amanda Potts, female    DOB: 1946/05/08, 71 y.o.   MRN: [id number]  HPI  71 year old Female for health maintenance exam and evaluation of medical issues.  Declines flu vaccine.  History of essential hypertension and hypothyroidism.  History of osteopenia and vitamin D deficiency.  History of elevated serum glucose.  She usually comes in once yearly for physical exam.  Past medical history: Bilateral cataract extractions, colonoscopy 2009.  She tried Actonel for osteopenia but it caused myalgias.  She tried Fosamax but it caused heartburn.  At this point we are just treating osteopenia with vitamin D  She is allergic to sulfa.  Social history: She is married.  Poorly tolerated [employer] and at [employer] in [place].  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  Social alcohol consumption.  3 adult children.  Daughter resides in [place].  Oldest son lives here in [place].  She has several grandchildren.  Family history: Brother living in good health in his late 37s.  Father died at age 46 with dementia and stroke.  Mother died at age 57 with COPD and heart failure.  One brother died at age 70 with lung cancer and was a smoker.      Review of Systems no new complaints     Objective:   Physical Exam  Constitutional: She is oriented to person, place, and time. She appears well-developed and well-nourished. No distress.  HENT:  Head: Normocephalic.  Right Ear: External ear normal.  Left Ear: External ear normal.  Eyes: Conjunctivae and EOM are normal. Pupils are equal, round, and reactive to light. Right eye exhibits no discharge. Left eye exhibits no discharge.  Neck: Neck supple. No JVD present. No thyromegaly present.  Cardiovascular: Normal rate, normal heart sounds and intact distal pulses.  No murmur heard. Pulmonary/Chest: She has no wheezes. She has no rales.  Abdominal: Soft. Bowel sounds are  normal. She exhibits no distension and no mass. There is no tenderness. There is no rebound and no guarding.  Genitourinary:  Genitourinary Comments: Bimanual normal Pap not done due to age  Musculoskeletal: Normal range of motion. She exhibits no edema.  Lymphadenopathy:    She has no cervical adenopathy.  Neurological: She is alert and oriented to person, place, and time. She has normal reflexes. No cranial nerve deficit.  Skin: Skin is warm and dry. She is not diaphoretic.  Psychiatric: She has a normal mood and affect. Her behavior is normal. Judgment and thought content normal.  Vitals reviewed.         Assessment & Plan:  Essential hypertension-stable  Hypothyroidism-stable  History of osteopenia  History of vitamin D deficiency  History of elevated total cholesterol-watch diet  History of elevated serum glucose with normal hemoglobin A1c  Plan: Continue same medications and return in 1 year.  Her cholesterol is elevated but I think is due to the Thanksgiving holidays and is 237 and previously was 193.  HDL is 113 and LDL 105.  Subjective:   Patient presents for Medicare Annual/Subsequent preventive examination.  Review Past Medical/Family/Social: See above  Risk Factors  Current exercise habits: Silver sneakers Dietary issues discussed: Low-fat low carbohydrate  Cardiac risk factors: Father with stroke.  Mother with heart failure  Depression Screen  (Note: if answer to either of the following is "Yes", a more complete depression screening is indicated)   Over the past two weeks, have you  felt down, depressed or hopeless? No  Over the past two weeks, have you felt little interest or pleasure in doing things? No Have you lost interest or pleasure in daily life? No Do you often feel hopeless? No Do you cry easily over simple problems? No   Activities of Daily Living  In your present state of health, do you have any difficulty performing the following  activities?:   Driving? No  Managing money? No  Feeding yourself? No  Getting from bed to chair? No  Climbing a flight of stairs? No  Preparing food and eating?: No  Bathing or showering? No  Getting dressed: No  Getting to the toilet? No  Using the toilet:No  Moving around from place to place: No  In the past year have you fallen or had a near fall?:  Yes tripped over luggage Are you sexually active? No  Do you have more than one partner? No   Hearing Difficulties: No  Do you often ask people to speak up or repeat themselves? No  Do you experience ringing or noises in your ears? No  Do you have difficulty understanding soft or whispered voices? No  Do you feel that you have a problem with memory? No Do you often misplace items? No    Home Safety:  Do you have a smoke alarm at your residence? Yes Do you have grab bars in the bathroom?  Yes Do you have throw rugs in your house?  No   Cognitive Testing  Alert? Yes Normal Appearance?Yes  Oriented to person? Yes Place? Yes  Time? Yes  Recall of three objects? Yes  Can perform simple calculations? Yes  Displays appropriate judgment?Yes  Can read the correct time from a watch face?Yes   List the Names of Other Physician/Practitioners you currently use:  See referral list for the physicians patient is currently seeing.     Review of Systems: Above   Objective:     General appearance well-developed female no acute distress pleasant Head: Normocephalic, without obvious abnormality, atraumatic  Eyes: conj clear, EOMi PEERLA  Ears: normal TM's and external ear canals both ears  Nose: Nares normal. Septum midline. Mucosa normal. No drainage or sinus tenderness.  Throat: lips, mucosa, and tongue normal; teeth and gums normal  Neck: no adenopathy, no carotid bruit, no JVD, supple, symmetrical, trachea midline and thyroid not enlarged, symmetric, no tenderness/mass/nodules  No CVA tenderness.  Lungs: clear to auscultation  bilaterally  Breasts: normal appearance, no masses or tenderness Heart: regular rate and rhythm, S1, S2 normal, no murmur, click, rub or gallop  Abdomen: soft, non-tender; bowel sounds normal; no masses, no organomegaly  Musculoskeletal: ROM normal in all joints, no crepitus, no deformity, Normal muscle strengthen. Back  is symmetric, no curvature. Skin: Skin color, texture, turgor normal. No rashes or lesions  Lymph nodes: Cervical, supraclavicular, and axillary nodes normal.  Neurologic: CN 2 -12 Normal, Normal symmetric reflexes. Normal coordination and gait  Psych: Alert & Oriented x 3, Mood appear stable.    Assessment:    Annual wellness medicare exam   Plan:    During the course of the visit the patient was educated and counseled about appropriate screening and preventive services including:   Recommend annual mammogram  Declines flu vaccine     Patient Instructions (the written plan) was given to the patient.  Medicare Attestation  I have personally reviewed:  The patient's medical and social history  Their use of alcohol, tobacco or illicit drugs  Their current medications and supplements  The patient's functional ability including ADLs,fall risks, home safety risks, cognitive, and hearing and visual impairment  Diet and physical activities  Evidence for depression or mood disorders  The patient's weight, height, BMI, and visual acuity have been recorded in the chart. I have made referrals, counseling, and provided education to the patient based on review of the above and I have provided the patient with a written personalized care plan for preventive services.

## 2017-11-02 ENCOUNTER — Ambulatory Visit
Admission: RE | Admit: 2017-11-02 | Discharge: 2017-11-02 | Disposition: A | Discharge status: Home / Self Care | Payer: Medicare Other | Source: Ambulatory Visit | Attending: Internal Medicine | Admitting: Internal Medicine

## 2017-11-02 DIAGNOSIS — Z1231 Encounter for screening mammogram for malignant neoplasm of breast: Secondary | ICD-10-CM

## 2017-11-27 ENCOUNTER — Other Ambulatory Visit: Payer: Self-pay | Admitting: Internal Medicine

## 2017-11-27 MED ORDER — SYNTHROID 75 MCG PO TABS: ORAL_TABLET | ORAL | 0 refills | Status: DC

## 2018-02-23 ENCOUNTER — Other Ambulatory Visit: Payer: Self-pay | Admitting: Internal Medicine

## 2018-08-13 ENCOUNTER — Other Ambulatory Visit: Payer: Self-pay | Admitting: Internal Medicine

## 2018-10-22 ENCOUNTER — Other Ambulatory Visit: Payer: Self-pay | Admitting: Internal Medicine

## 2018-10-22 DIAGNOSIS — E039 Hypothyroidism, unspecified: Secondary | ICD-10-CM

## 2018-10-22 DIAGNOSIS — Z0000 Encounter for general adult medical examination without abnormal findings: Secondary | ICD-10-CM

## 2018-10-22 DIAGNOSIS — I10 Essential (primary) hypertension: Secondary | ICD-10-CM

## 2018-10-22 DIAGNOSIS — M8580 Other specified disorders of bone density and structure, unspecified site: Secondary | ICD-10-CM

## 2018-10-23 ENCOUNTER — Other Ambulatory Visit: Payer: Medicare Other | Admitting: Internal Medicine

## 2018-10-25 ENCOUNTER — Other Ambulatory Visit: Payer: Medicare Other | Admitting: Internal Medicine

## 2018-10-25 DIAGNOSIS — M8580 Other specified disorders of bone density and structure, unspecified site: Secondary | ICD-10-CM

## 2018-10-25 DIAGNOSIS — E039 Hypothyroidism, unspecified: Secondary | ICD-10-CM

## 2018-10-25 DIAGNOSIS — Z0000 Encounter for general adult medical examination without abnormal findings: Secondary | ICD-10-CM

## 2018-10-25 DIAGNOSIS — I10 Essential (primary) hypertension: Secondary | ICD-10-CM

## 2018-10-26 ENCOUNTER — Ambulatory Visit (INDEPENDENT_AMBULATORY_CARE_PROVIDER_SITE_OTHER): Payer: Medicare Other | Admitting: Internal Medicine

## 2018-10-26 ENCOUNTER — Encounter: Payer: Self-pay | Admitting: Internal Medicine

## 2018-10-26 VITALS — BP 140/90 | HR 61 | Ht 66.0 in | Wt 140.0 lb

## 2018-10-26 DIAGNOSIS — Z8639 Personal history of other endocrine, nutritional and metabolic disease: Secondary | ICD-10-CM | POA: Diagnosis not present

## 2018-10-26 DIAGNOSIS — M8580 Other specified disorders of bone density and structure, unspecified site: Secondary | ICD-10-CM

## 2018-10-26 DIAGNOSIS — I10 Essential (primary) hypertension: Secondary | ICD-10-CM

## 2018-10-26 DIAGNOSIS — E039 Hypothyroidism, unspecified: Secondary | ICD-10-CM | POA: Diagnosis not present

## 2018-10-26 DIAGNOSIS — Z0000 Encounter for general adult medical examination without abnormal findings: Secondary | ICD-10-CM | POA: Diagnosis not present

## 2018-10-26 DIAGNOSIS — E7800 Pure hypercholesterolemia, unspecified: Secondary | ICD-10-CM

## 2018-10-26 LAB — COMPLETE METABOLIC PANEL WITH GFR
AG Ratio: 2.2 (calc) (ref 1.0–2.5)
ALT: 12 U/L (ref 6–29)
AST: 18 U/L (ref 10–35)
Albumin: 4.3 g/dL (ref 3.6–5.1)
Alkaline phosphatase (APISO): 45 U/L (ref 33–130)
BUN: 15 mg/dL (ref 7–25)
CO2: 28 mmol/L (ref 20–32)
Calcium: 9.2 mg/dL (ref 8.6–10.4)
Chloride: 105 mmol/L (ref 98–110)
Creat: 0.7 mg/dL (ref 0.60–0.93)
GFR, Est African American: 100 mL/min/{1.73_m2} (ref >=60)
GFR, Est Non African American: 87 mL/min/{1.73_m2} (ref >=60)
Globulin: 2 g/dL (calc) (ref 1.9–3.7)
Glucose, Bld: 111 mg/dL — ABNORMAL HIGH (ref 65–99)
Potassium: 4.4 mmol/L (ref 3.5–5.3)
Sodium: 140 mmol/L (ref 135–146)
Total Bilirubin: 0.6 mg/dL (ref 0.2–1.2)
Total Protein: 6.3 g/dL (ref 6.1–8.1)

## 2018-10-26 LAB — CBC WITH DIFFERENTIAL/PLATELET
BASOS PCT: 0.5 %
Basophils Absolute: 29 cells/uL (ref 0–200)
Eosinophils Absolute: 103 cells/uL (ref 15–500)
Eosinophils Relative: 1.8 %
HCT: 39.9 % (ref 35.0–45.0)
Hemoglobin: 13.4 g/dL (ref 11.7–15.5)
Lymphs Abs: 1647 cells/uL (ref 850–3900)
MCH: 30.8 pg (ref 27.0–33.0)
MCHC: 33.6 g/dL (ref 32.0–36.0)
MCV: 91.7 fL (ref 80.0–100.0)
MPV: 11 fL (ref 7.5–12.5)
Monocytes Relative: 8.8 %
Neutro Abs: 3420 cells/uL (ref 1500–7800)
Neutrophils Relative %: 60 %
Platelets: 220 10*3/uL (ref 140–400)
RBC: 4.35 10*6/uL (ref 3.80–5.10)
RDW: 12.5 % (ref 11.0–15.0)
Total Lymphocyte: 28.9 %
WBC: 5.7 10*3/uL (ref 3.8–10.8)
WBCMIX: 502 {cells}/uL (ref 200–950)

## 2018-10-26 LAB — LIPID PANEL
CHOL/HDL RATIO: 2.6 (calc) (ref <5.0)
Cholesterol: 224 mg/dL — ABNORMAL HIGH (ref <200)
HDL: 86 mg/dL (ref >50)
LDL Cholesterol (Calc): 118 mg/dL (calc) — ABNORMAL HIGH
Non-HDL Cholesterol (Calc): 138 mg/dL (calc) — ABNORMAL HIGH (ref <130)
Triglycerides: 98 mg/dL (ref <150)

## 2018-10-26 LAB — POCT URINALYSIS DIPSTICK
Appearance: NEGATIVE
Bilirubin, UA: NEGATIVE
Blood, UA: NEGATIVE
Glucose, UA: NEGATIVE
Ketones, UA: NEGATIVE
Leukocytes, UA: NEGATIVE
Nitrite, UA: NEGATIVE
Odor: NEGATIVE
Protein, UA: NEGATIVE
Spec Grav, UA: 1.015 (ref 1.010–1.025)
Urobilinogen, UA: 0.2 E.U./dL
pH, UA: 6.5 (ref 5.0–8.0)

## 2018-10-26 LAB — TSH: TSH: 2.33 mIU/L (ref 0.40–4.50)

## 2018-10-26 MED ORDER — ROSUVASTATIN CALCIUM 5 MG PO TABS: ORAL_TABLET | ORAL | 3 refills | Status: DC

## 2018-10-26 NOTE — Progress Notes (Signed)
Subjective:    Patient ID: Amanda Potts, female    DOB: 1946-05-10, 72 y.o.   MRN: [id number]  HPI  72 year old Female for Medicare wellness, health maintenance exam, and evaluation of medical issues.  Mild elevation of total and LDL cholesterol. Try Crestor 5 mg twice a week.  History of essential hypertension, hypothyroidism, osteopenia, elevated serum glucose.  Generally just comes in once yearly for physical exam.  Past medical history: Bilateral cataract extractions.  She tried Actonel for osteopenia but it caused myalgias.  She tried Fosamax but it caused heartburn.  At this point we just treat osteopenia with vitamin D supplement.  She is allergic to Sulfa.  Social history: She is married.  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  Social alcohol consumption.  3 adult children.  Daughter resides in [place] and oldest son lives here in [place].  She has several grandchildren.  Enjoys traveling.  Family Hx: Brother living in good health in his late 70s.  Father died at age 35 with dementia and stroke.  Mother died at age 78 with COPD and heart failure.  One brother died at age 66 with lung cancer and was a smoker.      Review of Systems  Constitutional: Negative.   All other systems reviewed and are negative.      Objective:   Physical Exam Vitals signs reviewed.  Constitutional:      General: She is not in acute distress.    Appearance: Normal appearance. She is not diaphoretic.  HENT:     Head: Normocephalic and atraumatic.     Right Ear: Tympanic membrane normal.     Left Ear: Tympanic membrane normal.     Nose: Nose normal.     Mouth/Throat:     Mouth: Mucous membranes are moist.     Pharynx: Oropharynx is clear.  Eyes:     General: No scleral icterus.    Extraocular Movements: Extraocular movements intact.     Conjunctiva/sclera:  Conjunctivae normal.     Pupils: Pupils are equal, round, and reactive to light.  Neck:     Musculoskeletal: Neck supple.     Vascular: No carotid bruit.  Cardiovascular:     Rate and Rhythm: Normal rate and regular rhythm.     Heart sounds: Normal heart sounds. No murmur.  Pulmonary:     Effort: Pulmonary effort is normal. No respiratory distress.     Breath sounds: Normal breath sounds. No wheezing or rhonchi.     Comments: Breast without masses Abdominal:     Palpations: Abdomen is soft. There is no mass.     Tenderness: There is no abdominal tenderness. There is no rebound.  Genitourinary:    Comments: Bimanual normal.  Pap not done due to age Musculoskeletal:        General: No swelling or deformity.     Right lower leg: No edema.     Left lower leg: No edema.  Lymphadenopathy:     Cervical: No cervical adenopathy.  Skin:    General: Skin is warm and dry.  Neurological:     General: No focal deficit present.     Mental Status: She is alert and oriented to person, place, and time.     Cranial Nerves: No cranial nerve deficit.     Sensory: No sensory deficit.  Psychiatric:  Mood and Affect: Mood normal.        Behavior: Behavior normal.        Thought Content: Thought content normal.        Judgment: Judgment normal.           Assessment & Plan:  Hypothyroidism-stable on thyroid replacement therapy  History of osteopenia-intolerant to bisphosphonates and just takes vitamin D supplement  History of vitamin D deficiency- not checked with this visit is Medicare does not pay for vitamin D testing  Essential hypertension-stable on current regimen  History of elevated child total cholesterol-have suggested patient take lipid-lowering medication 2 days a week but total cholesterol is 224, HDL 86, LDL 118.  Triglycerides are normal at 98.  She will follow-up with lipid panel and liver functions end of January.  History of elevated serum glucose at 111 with normal  hemoglobin A1c in the past  Plan: Follow-up late January regarding lipids.  Watch diet.  Subjective:   Patient presents for Medicare Annual/Subsequent preventive examination.  Review Past Medical/Family/Social: See above   Risk Factors  Current exercise habits: Tries to get regular exercise especially with walking Dietary issues discussed: Low-fat low carbohydrate  Cardiac risk factors: Hyperlipidemia  Depression Screen  (Note: if answer to either of the following is "Yes", a more complete depression screening is indicated)   Over the past two weeks, have you felt down, depressed or hopeless? No  Over the past two weeks, have you felt little interest or pleasure in doing things? No Have you lost interest or pleasure in daily life? No Do you often feel hopeless? No Do you cry easily over simple problems? No   Activities of Daily Living  In your present state of health, do you have any difficulty performing the following activities?:   Driving? No  Managing money? No  Feeding yourself? No  Getting from bed to chair? No  Climbing a flight of stairs? No  Preparing food and eating?: No  Bathing or showering? No  Getting dressed: No  Getting to the toilet? No  Using the toilet:No  Moving around from place to place: No  In the past year have you fallen or had a near fall?:yes-1 fall when I am slipped on some gum balls on a hill by [place] but did not have any injury Are you sexually active? No  Do you have more than one partner? No   Hearing Difficulties: No  Do you often ask people to speak up or repeat themselves?  Yes Do you experience ringing or noises in your ears? No  Do you have difficulty understanding soft or whispered voices?  Yes Do you feel that you have a problem with memory? No Do you often misplace items? No    Home Safety:  Do you have a smoke alarm at your residence? Yes Do you have grab bars in the bathroom?  Yes Do you have throw rugs in your house?   No   Cognitive Testing  Alert? Yes Normal Appearance?Yes  Oriented to person? Yes Place? Yes  Time? Yes  Recall of three objects? Yes  Can perform simple calculations? Yes  Displays appropriate judgment?Yes  Can read the correct time from a watch face?Yes   List the Names of Other Physician/Practitioners you currently use:  See referral list for the physicians patient is currently seeing.     Review of Systems: See above   Objective:     General appearance: Appears stated age  Head: Normocephalic, without  obvious abnormality, atraumatic  Eyes: conj clear, EOMi PEERLA  Ears: normal TM's and external ear canals both ears  Nose: Nares normal. Septum midline. Mucosa normal. No drainage or sinus tenderness.  Throat: lips, mucosa, and tongue normal; teeth and gums normal  Neck: no adenopathy, no carotid bruit, no JVD, supple, symmetrical, trachea midline and thyroid not enlarged, symmetric, no tenderness/mass/nodules  No CVA tenderness.  Lungs: clear to auscultation bilaterally  Breasts: normal appearance, no masses or tenderness Heart: regular rate and rhythm, S1, S2 normal, no murmur, click, rub or gallop  Abdomen: soft, non-tender; bowel sounds normal; no masses, no organomegaly  Musculoskeletal: ROM normal in all joints, no crepitus, no deformity, Normal muscle strengthen. Back  is symmetric, no curvature. Skin: Skin color, texture, turgor normal. No rashes or lesions  Lymph nodes: Cervical, supraclavicular, and axillary nodes normal.  Neurologic: CN 2 -12 Normal, Normal symmetric reflexes. Normal coordination and gait  Psych: Alert & Oriented x 3, Mood appear stable.    Assessment:    Annual wellness medicare exam   Plan:    During the course of the visit the patient was educated and counseled about appropriate screening and preventive services including:   Annual mammogram  Recommend annual flu vaccine     Patient Instructions (the written plan) was given to  the patient.  Medicare Attestation  I have personally reviewed:  The patient's medical and social history  Their use of alcohol, tobacco or illicit drugs  Their current medications and supplements  The patient's functional ability including ADLs,fall risks, home safety risks, cognitive, and hearing and visual impairment  Diet and physical activities  Evidence for depression or mood disorders  The patient's weight, height, BMI, and visual acuity have been recorded in the chart. I have made referrals, counseling, and provided education to the patient based on review of the above and I have provided the patient with a written personalized care plan for preventive services.

## 2018-11-13 ENCOUNTER — Other Ambulatory Visit: Payer: Self-pay | Admitting: Internal Medicine

## 2018-11-19 ENCOUNTER — Other Ambulatory Visit: Payer: Self-pay | Admitting: Internal Medicine

## 2018-11-28 ENCOUNTER — Other Ambulatory Visit: Payer: Self-pay | Admitting: Internal Medicine

## 2018-11-28 DIAGNOSIS — Z1231 Encounter for screening mammogram for malignant neoplasm of breast: Secondary | ICD-10-CM

## 2018-11-30 ENCOUNTER — Encounter: Payer: Self-pay | Admitting: Internal Medicine

## 2018-11-30 ENCOUNTER — Ambulatory Visit (INDEPENDENT_AMBULATORY_CARE_PROVIDER_SITE_OTHER): Payer: Medicare PPO | Admitting: Internal Medicine

## 2018-11-30 VITALS — BP 120/70 | HR 68 | Temp 98.1°F | Ht 66.0 in | Wt 142.0 lb

## 2018-11-30 DIAGNOSIS — R300 Dysuria: Secondary | ICD-10-CM

## 2018-11-30 DIAGNOSIS — R350 Frequency of micturition: Secondary | ICD-10-CM

## 2018-11-30 DIAGNOSIS — R8290 Unspecified abnormal findings in urine: Secondary | ICD-10-CM

## 2018-11-30 DIAGNOSIS — N3090 Cystitis, unspecified without hematuria: Secondary | ICD-10-CM

## 2018-11-30 MED ORDER — NITROFURANTOIN MONOHYD MACRO 100 MG PO CAPS: 100.0000 mg | ORAL_CAPSULE | Freq: Two times a day (BID) | ORAL | 0 refills | Status: DC

## 2018-11-30 NOTE — Progress Notes (Signed)
   Subjective:    Patient ID: Amanda Potts, female    DOB: 12-Nov-1946, 73 y.o.   MRN: [id number]  HPI Onset 2 days ago of frequency. No heamturia. Has had some dysuria. No fever or chills.    Review of Systems some history of urgency with lots of fluids but no incontinence. No stress incontinence     Objective:   Physical Exam  VSS and reviewed. No CVA tenderness. Urine dipstick results reviewed. No acute distress. Culture sent   At least15 minutes spent with patient including time spent reviewing results of urine dipstick and culture as well as notifying pt of results.      Assessment & Plan:  Cystitis Plan: Macrobid 100 mg bid x 7 days. Culture pending.

## 2018-12-01 NOTE — Patient Instructions (Signed)
Macrobid 100 mg bid x 7 days. Urine culture pending and to be reviewed.

## 2018-12-02 ENCOUNTER — Telehealth: Payer: Self-pay | Admitting: Internal Medicine

## 2018-12-02 ENCOUNTER — Encounter: Payer: Self-pay | Admitting: Internal Medicine

## 2018-12-02 LAB — URINE CULTURE
MICRO NUMBER: 39188
SPECIMEN QUALITY:: ADEQUATE

## 2018-12-02 NOTE — Telephone Encounter (Signed)
Phone call to tell pt she has confirmed UTI. Was disconnected. Needs follow up late January with lipid appt.

## 2018-12-02 NOTE — Telephone Encounter (Signed)
Pt called back and was given urine culture results. Feeling better. Follow up with urine specimen and nurse visit on January 30 when here for lipid and liver panels.

## 2018-12-02 NOTE — Patient Instructions (Signed)
It was a pleasure to see you today.  Start statin medication twice weekly and follow-up end of January.  Continue diet and exercise efforts and continue current medications.

## 2018-12-03 LAB — POCT URINALYSIS DIPSTICK
Bilirubin, UA: NEGATIVE
Glucose, UA: NEGATIVE
KETONES UA: NEGATIVE
Nitrite, UA: NEGATIVE
Protein, UA: NEGATIVE
SPEC GRAV UA: 1.015 (ref 1.010–1.025)
Urobilinogen, UA: 0.2 E.U./dL
pH, UA: 6.5 (ref 5.0–8.0)

## 2018-12-20 ENCOUNTER — Encounter: Payer: Self-pay | Admitting: Internal Medicine

## 2018-12-20 ENCOUNTER — Other Ambulatory Visit (INDEPENDENT_AMBULATORY_CARE_PROVIDER_SITE_OTHER): Payer: Medicare PPO | Admitting: Internal Medicine

## 2018-12-20 VITALS — BP 150/80 | HR 80 | Temp 98.6°F | Ht 66.0 in | Wt 142.0 lb

## 2018-12-20 DIAGNOSIS — R8290 Unspecified abnormal findings in urine: Secondary | ICD-10-CM | POA: Diagnosis not present

## 2018-12-20 DIAGNOSIS — E7800 Pure hypercholesterolemia, unspecified: Secondary | ICD-10-CM | POA: Diagnosis not present

## 2018-12-20 NOTE — Addendum Note (Signed)
Addended by: Mady Haagensen on: 12/20/2018 09:30 AM   Modules accepted: Orders, Level of Service

## 2018-12-21 LAB — LIPID PANEL
Cholesterol: 188 mg/dL (ref <200)
HDL: 85 mg/dL (ref >50)
LDL Cholesterol (Calc): 85 mg/dL (calc)
Non-HDL Cholesterol (Calc): 103 mg/dL (calc) (ref <130)
Total CHOL/HDL Ratio: 2.2 (calc) (ref <5.0)
Triglycerides: 85 mg/dL (ref <150)

## 2018-12-21 LAB — HEPATIC FUNCTION PANEL
AG Ratio: 1.8 (calc) (ref 1.0–2.5)
ALKALINE PHOSPHATASE (APISO): 44 U/L (ref 33–130)
ALT: 10 U/L (ref 6–29)
AST: 14 U/L (ref 10–35)
Albumin: 4.2 g/dL (ref 3.6–5.1)
BILIRUBIN INDIRECT: 0.4 mg/dL (ref 0.2–1.2)
Bilirubin, Direct: 0.1 mg/dL (ref 0.0–0.2)
Globulin: 2.3 g/dL (calc) (ref 1.9–3.7)
Total Bilirubin: 0.5 mg/dL (ref 0.2–1.2)
Total Protein: 6.5 g/dL (ref 6.1–8.1)

## 2018-12-27 ENCOUNTER — Ambulatory Visit
Admission: RE | Admit: 2018-12-27 | Discharge: 2018-12-27 | Disposition: A | Discharge status: Home / Self Care | Payer: Medicare PPO | Source: Ambulatory Visit | Attending: Internal Medicine | Admitting: Internal Medicine

## 2018-12-27 DIAGNOSIS — Z1231 Encounter for screening mammogram for malignant neoplasm of breast: Secondary | ICD-10-CM

## 2019-02-07 ENCOUNTER — Other Ambulatory Visit: Payer: Self-pay | Admitting: Internal Medicine

## 2019-02-22 ENCOUNTER — Other Ambulatory Visit: Payer: Self-pay | Admitting: Internal Medicine

## 2019-03-21 ENCOUNTER — Telehealth: Payer: Self-pay

## 2019-03-21 ENCOUNTER — Encounter: Payer: Self-pay | Admitting: Internal Medicine

## 2019-03-21 ENCOUNTER — Ambulatory Visit (INDEPENDENT_AMBULATORY_CARE_PROVIDER_SITE_OTHER): Payer: Medicare PPO | Admitting: Internal Medicine

## 2019-03-21 ENCOUNTER — Other Ambulatory Visit: Payer: Self-pay

## 2019-03-21 DIAGNOSIS — S90561A Insect bite (nonvenomous), right ankle, initial encounter: Secondary | ICD-10-CM

## 2019-03-21 DIAGNOSIS — W57XXXA Bitten or stung by nonvenomous insect and other nonvenomous arthropods, initial encounter: Secondary | ICD-10-CM

## 2019-03-21 MED ORDER — DOXYCYCLINE HYCLATE 100 MG PO TABS: 100.0000 mg | ORAL_TABLET | Freq: Two times a day (BID) | ORAL | 0 refills | Status: DC

## 2019-03-21 NOTE — Telephone Encounter (Signed)
Yes--- virtual visit  

## 2019-03-21 NOTE — Progress Notes (Signed)
   Subjective:    Patient ID: Amanda Potts, female    DOB: [birth year], 73 y.o.   MRN: [id number]  HPI 73 year old Female seen today by interactive audio and video telecommunications because of the Coronavirus outbreak.  She consented to this format of visit today.  Patient identified by 2 identifiers as Amanda Potts, a patient in this practice.  Patient indicates she was walking on a wooded trail yesterday and discovered a tick lodged on her ankle lateral aspect.  Patient identified the tick is a [place] tick and is concerned about developing a beef allergy.  I do not know of anything that she can do to prevent that reaction from happening and spent 10 minutes reviewing Up to Date on this issues. No preventatives were advised.  She says several of her friends have been allergy related to tick bite and she is concerned.      Review of Systems see above     Objective:   Physical Exam Based on video inspection do not see any area of cellulitis or inflammation on her ankle from the bite       Assessment & Plan:  Tick bite  Plan: Doxycycline 100 mg twice daily for 7 days.  We discussed whether or not to treat the tick bite or not.  I think in view of the pandemic we will go ahead and give her doxycycline 100 mg twice daily for 7 days.  I do not think this is been shown to prevent beef allergy.  My professional opinion, it is prudent to go ahead and treat her during this pandemic.  She currently has no evidence of cellulitis related to the tick bite that I can see.

## 2019-03-21 NOTE — Telephone Encounter (Signed)
Patient called states she got bit by a tick yesterday and would like to know if she should be concerned? Virtual visit?

## 2019-03-21 NOTE — Patient Instructions (Signed)
Doxycycline 100 mg twice daily for 7 days

## 2019-03-21 NOTE — Telephone Encounter (Signed)
Scheduled Virtual Visit °

## 2019-05-01 ENCOUNTER — Other Ambulatory Visit: Payer: Self-pay | Admitting: Internal Medicine

## 2019-05-31 DIAGNOSIS — H35363 Drusen (degenerative) of macula, bilateral: Secondary | ICD-10-CM | POA: Diagnosis not present

## 2019-05-31 DIAGNOSIS — H0102A Squamous blepharitis right eye, upper and lower eyelids: Secondary | ICD-10-CM | POA: Diagnosis not present

## 2019-05-31 DIAGNOSIS — H353131 Nonexudative age-related macular degeneration, bilateral, early dry stage: Secondary | ICD-10-CM | POA: Diagnosis not present

## 2019-05-31 DIAGNOSIS — H0102B Squamous blepharitis left eye, upper and lower eyelids: Secondary | ICD-10-CM | POA: Diagnosis not present

## 2019-08-01 ENCOUNTER — Other Ambulatory Visit: Payer: Self-pay

## 2019-08-01 ENCOUNTER — Ambulatory Visit (INDEPENDENT_AMBULATORY_CARE_PROVIDER_SITE_OTHER): Payer: Medicare PPO | Admitting: Internal Medicine

## 2019-08-01 ENCOUNTER — Encounter: Payer: Self-pay | Admitting: Internal Medicine

## 2019-08-01 VITALS — BP 140/90 | HR 68 | Temp 98.1°F | Ht 66.0 in | Wt 142.0 lb

## 2019-08-01 DIAGNOSIS — Z23 Encounter for immunization: Secondary | ICD-10-CM

## 2019-08-01 NOTE — Progress Notes (Signed)
Flu vaccine given by CMA 

## 2019-08-01 NOTE — Patient Instructions (Signed)
Patient received a flu vaccine IM L deltoid, AV, CMA  

## 2019-08-19 ENCOUNTER — Other Ambulatory Visit: Payer: Self-pay | Admitting: Internal Medicine

## 2019-08-19 NOTE — Telephone Encounter (Signed)
Due for CPE November. Please call her before refilling. Do not see appt in EPIC

## 2019-10-31 ENCOUNTER — Ambulatory Visit (INDEPENDENT_AMBULATORY_CARE_PROVIDER_SITE_OTHER): Payer: Medicare PPO | Admitting: Internal Medicine

## 2019-10-31 ENCOUNTER — Other Ambulatory Visit: Payer: Self-pay

## 2019-10-31 DIAGNOSIS — Z0000 Encounter for general adult medical examination without abnormal findings: Secondary | ICD-10-CM | POA: Diagnosis not present

## 2019-11-11 ENCOUNTER — Other Ambulatory Visit: Payer: Self-pay | Admitting: Internal Medicine

## 2019-11-18 NOTE — Progress Notes (Signed)
   Subjective:    Patient ID: Amanda Potts, female    DOB: [birth year], 73 y.o.   MRN: [id number]  HPI 73 year old Female seen today for annual Medicare wellness visit via interactive audio and video telecommunications due to coronavirus pandemic.  She is agreeable to visit in this format today.  She is identified using 2 identifiers as Amanda Potts, a patient in this practice.  She has been well during the pandemic and has had no known COVID-19 exposure.  Her general health is good.  She is on Crestor twice a week for hyperlipidemia. She takes metoprolol for hypertension and is on thyroid replacement medication for hypothyroidism. History of osteopenia and elevated serum glucose.  Depression screen performed. Has not felt depressed or hopeless during the pandemic. No issues with driving, managing money, feeding herself, ambulating, getting dressed, going;. Has not had a fall. Denies being sexually active. No hearing difficulties whatsoever. No trouble with memory or misplacing items. She has a smoke alarm at her residence. Has grab bars in the bathroom and no throw rugs at home. She and her husband have been well. They walk for exercise. 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 Social alcohol consumption. Non-smoker. 3 adult children. Oldest son lives here in [place] and she has several grandchildren.  Family history of dementia and stroke in her father who died at age 36. Mother died at age 65 with COPD and heart failure. 1 brother died at age 8 with lung cancer and was a smoker. 1 brother living in good health in his late 77s.  She has a history of osteopenia and tried Actonel but it caused myalgias. Fosamax caused heartburn. Currently just on vitamin D supplement.  History of bilateral cataract extractions.  Has CPE appointment in early January.        Review of Systems     Objective:   Physical Exam        Assessment & Plan:

## 2019-11-21 ENCOUNTER — Encounter: Payer: Self-pay | Admitting: Internal Medicine

## 2019-11-21 NOTE — Patient Instructions (Signed)
Medicare wellness exam performed virtually today due to the coronavirus pandemic. She has an appointment for in person CPE exam with fasting lab work early January.

## 2019-11-25 ENCOUNTER — Other Ambulatory Visit: Payer: Self-pay

## 2019-11-25 ENCOUNTER — Other Ambulatory Visit (INDEPENDENT_AMBULATORY_CARE_PROVIDER_SITE_OTHER): Payer: Medicare PPO | Admitting: Internal Medicine

## 2019-11-25 DIAGNOSIS — Z0000 Encounter for general adult medical examination without abnormal findings: Secondary | ICD-10-CM

## 2019-11-25 DIAGNOSIS — E039 Hypothyroidism, unspecified: Secondary | ICD-10-CM | POA: Diagnosis not present

## 2019-11-25 DIAGNOSIS — I10 Essential (primary) hypertension: Secondary | ICD-10-CM

## 2019-11-25 DIAGNOSIS — M8580 Other specified disorders of bone density and structure, unspecified site: Secondary | ICD-10-CM | POA: Diagnosis not present

## 2019-11-25 DIAGNOSIS — R7302 Impaired glucose tolerance (oral): Secondary | ICD-10-CM | POA: Diagnosis not present

## 2019-11-25 DIAGNOSIS — E7800 Pure hypercholesterolemia, unspecified: Secondary | ICD-10-CM

## 2019-11-26 ENCOUNTER — Other Ambulatory Visit: Payer: Self-pay

## 2019-11-26 ENCOUNTER — Ambulatory Visit: Payer: Medicare PPO | Admitting: Internal Medicine

## 2019-11-26 ENCOUNTER — Encounter: Payer: Self-pay | Admitting: Internal Medicine

## 2019-11-26 VITALS — BP 140/90 | HR 87 | Temp 98.1°F | Ht 65.25 in | Wt 143.0 lb

## 2019-11-26 DIAGNOSIS — E7800 Pure hypercholesterolemia, unspecified: Secondary | ICD-10-CM | POA: Diagnosis not present

## 2019-11-26 DIAGNOSIS — M8580 Other specified disorders of bone density and structure, unspecified site: Secondary | ICD-10-CM | POA: Diagnosis not present

## 2019-11-26 DIAGNOSIS — E039 Hypothyroidism, unspecified: Secondary | ICD-10-CM | POA: Diagnosis not present

## 2019-11-26 DIAGNOSIS — Z1231 Encounter for screening mammogram for malignant neoplasm of breast: Secondary | ICD-10-CM

## 2019-11-26 DIAGNOSIS — Z0000 Encounter for general adult medical examination without abnormal findings: Secondary | ICD-10-CM

## 2019-11-26 DIAGNOSIS — Z8639 Personal history of other endocrine, nutritional and metabolic disease: Secondary | ICD-10-CM | POA: Diagnosis not present

## 2019-11-26 DIAGNOSIS — I10 Essential (primary) hypertension: Secondary | ICD-10-CM

## 2019-11-26 LAB — COMPLETE METABOLIC PANEL WITH GFR
AG Ratio: 1.8 (calc) (ref 1.0–2.5)
ALT: 17 U/L (ref 6–29)
AST: 22 U/L (ref 10–35)
Albumin: 4.3 g/dL (ref 3.6–5.1)
Alkaline phosphatase (APISO): 53 U/L (ref 37–153)
BUN: 19 mg/dL (ref 7–25)
CO2: 27 mmol/L (ref 20–32)
Calcium: 9.4 mg/dL (ref 8.6–10.4)
Chloride: 104 mmol/L (ref 98–110)
Creat: 0.76 mg/dL (ref 0.60–0.93)
GFR, Est African American: 90 mL/min/{1.73_m2} (ref >=60)
GFR, Est Non African American: 78 mL/min/{1.73_m2} (ref >=60)
Globulin: 2.4 g/dL (calc) (ref 1.9–3.7)
Glucose, Bld: 108 mg/dL — ABNORMAL HIGH (ref 65–99)
Potassium: 4.9 mmol/L (ref 3.5–5.3)
Sodium: 140 mmol/L (ref 135–146)
Total Bilirubin: 0.6 mg/dL (ref 0.2–1.2)
Total Protein: 6.7 g/dL (ref 6.1–8.1)

## 2019-11-26 LAB — POCT URINALYSIS DIPSTICK
Appearance: NEGATIVE
Bilirubin, UA: NEGATIVE
Blood, UA: NEGATIVE
Glucose, UA: NEGATIVE
Ketones, UA: NEGATIVE
Leukocytes, UA: NEGATIVE
Nitrite, UA: NEGATIVE
Odor: NEGATIVE
Protein, UA: NEGATIVE
Spec Grav, UA: 1.015 (ref 1.010–1.025)
Urobilinogen, UA: 0.2 E.U./dL
pH, UA: 6.5 (ref 5.0–8.0)

## 2019-11-26 LAB — CBC WITH DIFFERENTIAL/PLATELET
Absolute Monocytes: 713 cells/uL (ref 200–950)
Basophils Absolute: 38 cells/uL (ref 0–200)
Basophils Relative: 0.5 %
Eosinophils Absolute: 180 cells/uL (ref 15–500)
Eosinophils Relative: 2.4 %
HCT: 41 % (ref 35.0–45.0)
Hemoglobin: 13.6 g/dL (ref 11.7–15.5)
Lymphs Abs: 2160 cells/uL (ref 850–3900)
MCH: 31.3 pg (ref 27.0–33.0)
MCHC: 33.2 g/dL (ref 32.0–36.0)
MCV: 94.5 fL (ref 80.0–100.0)
MPV: 10.8 fL (ref 7.5–12.5)
Monocytes Relative: 9.5 %
Neutro Abs: 4410 cells/uL (ref 1500–7800)
Neutrophils Relative %: 58.8 %
Platelets: 247 10*3/uL (ref 140–400)
RBC: 4.34 10*6/uL (ref 3.80–5.10)
RDW: 12.3 % (ref 11.0–15.0)
Total Lymphocyte: 28.8 %
WBC: 7.5 10*3/uL (ref 3.8–10.8)

## 2019-11-26 LAB — LIPID PANEL
Cholesterol: 230 mg/dL — ABNORMAL HIGH (ref <200)
HDL: 100 mg/dL (ref >=50)
LDL Cholesterol (Calc): 109 mg/dL (calc) — ABNORMAL HIGH
Non-HDL Cholesterol (Calc): 130 mg/dL (calc) — ABNORMAL HIGH (ref <130)
Total CHOL/HDL Ratio: 2.3 (calc) (ref <5.0)
Triglycerides: 105 mg/dL (ref <150)

## 2019-11-26 LAB — TSH: TSH: 2.5 mIU/L (ref 0.40–4.50)

## 2019-11-26 LAB — HEMOGLOBIN A1C
Hgb A1c MFr Bld: 5.4 % of total Hgb (ref <5.7)
Mean Plasma Glucose: 108 (calc)
eAG (mmol/L): 6 (calc)

## 2019-11-26 NOTE — Progress Notes (Signed)
   Subjective:    Patient ID: Amanda Potts, female    DOB: 07-22-1946, 74 y.o.   MRN: [id number]  HPI 74 year old  Female for health maintenance exam and evaluation of medical issues.  She had annual Medicare wellness visit virtually in December 2020.  She has a history of essential hypertension, hypothyroidism, osteopenia and an elevated serum glucose  Past medical history: Bilateral cataract extractions.  She tried Actonel for osteopenia but it caused myalgias.  She tried Fosamax but it caused heartburn.  At this point we just treat osteopenia with vitamin D supplement.   She is allergic to Sulfa.  Social history: She is married.  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  Social alcohol consumption.  3 adult children.  Daughter resides in [place] and oldest son lives here in [place].  She has several grandchildren.  Enjoys traveling.  Family history: Brother living in good health in his late 22s.  Father died at age 50 with dementia and stroke.  Mother died at age 62 with COPD and heart failure.  1 brother died at age 10 with lung cancer and was a smoker.      Review of Systems exercises regularly.  Is staying active during the pandemic.  Misses traveling.     Objective:   Physical Exam Vital signs reviewed.  Skin warm and dry.  Nodes none.  TMs are clear.  Pharynx is clear.  Neck is supple.  No JVD thyromegaly or carotid bruits.  Chest is clear to auscultation without rales or wheezing.  Cardiac exam regular rate and rhythm normal S1 and S2 without murmurs or gallops.  Abdomen soft nondistended without hepatosplenomegaly masses or tenderness.  Bimanual normal.  Pap not done due to age.  No lower extremity edema.  Neuro no focal deficits on brief neurological exam.  Affect thought and judgment are within normal limits.       Assessment & Plan:    Hyperlipidemia-total cholesterol is 230 but she has high HDL of 100.  LDL is 109 and triglycerides are 105.  Hemoglobin A1c is normal and fasting glucose is 108  Remote history of vitamin D deficiency-Medicare does not pay for vitamin D level.  She will continue to take vitamin D supplement.  Essential hypertension stable on current regimen   LDL 109-she will start low-dose statin therapy.  This will be generic Crestor 5 mg twice a week.  Hypothyroidism-stable on thyroid replacement medication  Plan: Patient looks well and I believe can return in 1 year or as needed.  She will monitor blood pressure at home.  Take COVID-19 vaccine when available.

## 2019-11-26 NOTE — Patient Instructions (Addendum)
It was a pleasure to see you today. Have mammogram and bone density in 2021. Watch BP at home.  Add Crestor 5 mg twice weekly.

## 2019-12-02 DIAGNOSIS — H353131 Nonexudative age-related macular degeneration, bilateral, early dry stage: Secondary | ICD-10-CM | POA: Diagnosis not present

## 2019-12-17 ENCOUNTER — Other Ambulatory Visit: Payer: Self-pay

## 2019-12-17 MED ORDER — ROSUVASTATIN CALCIUM 5 MG PO TABS: ORAL_TABLET | ORAL | 3 refills | Status: DC

## 2019-12-30 ENCOUNTER — Ambulatory Visit: Payer: Medicare PPO | Attending: Internal Medicine

## 2019-12-30 DIAGNOSIS — Z23 Encounter for immunization: Secondary | ICD-10-CM | POA: Insufficient documentation

## 2019-12-30 NOTE — Progress Notes (Signed)
   Covid-19 Vaccination Clinic  Name:  Amanda Potts    MRN: [id number] DOB: 1945/12/18  12/30/2019  [name] was observed post Covid-19 immunization for 15 minutes without incidence. She was provided with Vaccine Information Sheet and instruction to access the V-Safe system.   [name] was instructed to call 911 with any severe reactions post vaccine: Marland Kitchen Difficulty breathing  . Swelling of your face and throat  . A fast heartbeat  . A bad rash all over your body  . Dizziness and weakness    Immunizations Administered    Name Date Dose VIS Date Route   Pfizer COVID-19 Vaccine 12/30/2019  2:02 PM 0.3 mL 11/01/2019 Intramuscular   Manufacturer: [place]   Lot: [id number]   [place]: [id number]

## 2020-01-16 ENCOUNTER — Ambulatory Visit: Payer: Medicare PPO

## 2020-01-24 ENCOUNTER — Ambulatory Visit: Payer: Medicare PPO | Attending: Internal Medicine

## 2020-01-24 DIAGNOSIS — Z23 Encounter for immunization: Secondary | ICD-10-CM | POA: Insufficient documentation

## 2020-01-24 NOTE — Progress Notes (Signed)
   Covid-19 Vaccination Clinic  Name:  Amanda Potts    MRN: [id number] DOB: 1946-07-25  01/24/2020  [name] was observed post Covid-19 immunization for 15 minutes without incident. She was provided with Vaccine Information Sheet and instruction to access the V-Safe system.   [name] was instructed to call 911 with any severe reactions post vaccine: Marland Kitchen Difficulty breathing  . Swelling of face and throat  . A fast heartbeat  . A bad rash all over body  . Dizziness and weakness   Immunizations Administered    Name Date Dose VIS Date Route   Pfizer COVID-19 Vaccine 01/24/2020  9:40 AM 0.3 mL 11/01/2019 Intramuscular   Manufacturer: [place]   Lot: [id number]   [place]: [id number]

## 2020-02-11 ENCOUNTER — Other Ambulatory Visit: Payer: Self-pay | Admitting: Internal Medicine

## 2020-06-05 ENCOUNTER — Other Ambulatory Visit: Payer: Self-pay

## 2020-06-05 MED ORDER — METOPROLOL SUCCINATE ER 25 MG PO TB24: 25.0000 mg | ORAL_TABLET | Freq: Every day | ORAL | 0 refills | Status: DC

## 2020-06-08 DIAGNOSIS — H353131 Nonexudative age-related macular degeneration, bilateral, early dry stage: Secondary | ICD-10-CM | POA: Diagnosis not present

## 2020-06-08 DIAGNOSIS — H35363 Drusen (degenerative) of macula, bilateral: Secondary | ICD-10-CM | POA: Diagnosis not present

## 2020-06-08 DIAGNOSIS — H0102A Squamous blepharitis right eye, upper and lower eyelids: Secondary | ICD-10-CM | POA: Diagnosis not present

## 2020-06-08 DIAGNOSIS — H0102B Squamous blepharitis left eye, upper and lower eyelids: Secondary | ICD-10-CM | POA: Diagnosis not present

## 2020-07-30 ENCOUNTER — Other Ambulatory Visit: Payer: Self-pay | Admitting: Internal Medicine

## 2020-07-30 NOTE — Telephone Encounter (Signed)
Book CPE for January before refilling include medicare Wellness

## 2020-09-23 ENCOUNTER — Encounter: Payer: Self-pay | Admitting: Internal Medicine

## 2020-09-23 ENCOUNTER — Ambulatory Visit (INDEPENDENT_AMBULATORY_CARE_PROVIDER_SITE_OTHER): Payer: Medicare PPO | Admitting: Internal Medicine

## 2020-09-23 ENCOUNTER — Other Ambulatory Visit: Payer: Self-pay

## 2020-09-23 VITALS — BP 130/80 | HR 74 | Temp 98.1°F | Ht 65.25 in | Wt 143.0 lb

## 2020-09-23 DIAGNOSIS — Z23 Encounter for immunization: Secondary | ICD-10-CM

## 2020-09-23 NOTE — Patient Instructions (Signed)
Patient received a flu vaccine IM L deltoid, AV, CMA  

## 2020-09-30 ENCOUNTER — Ambulatory Visit: Payer: Medicare PPO | Admitting: Internal Medicine

## 2020-10-04 NOTE — Progress Notes (Signed)
Flu vaccine per CMA 

## 2020-10-06 ENCOUNTER — Telehealth: Payer: Self-pay | Admitting: Internal Medicine

## 2020-10-06 MED ORDER — BETAMETHASONE DIPROPIONATE 0.05 % EX CREA: TOPICAL_CREAM | Freq: Two times a day (BID) | CUTANEOUS | 0 refills | Status: DC

## 2020-10-06 NOTE — Telephone Encounter (Signed)
Refill once 

## 2020-10-06 NOTE — Telephone Encounter (Signed)
Amanda Potts [phone]  Margret called to say she would like to get a refill on below medication, she has not got it in a long time, but she is just about out of it. It really helps her eczema, and it has flared up since she has turned the heat on.  betamethasone dipropionate (DIPROLENE) 0.05 % cream  [id number] [address], [place] Phone:  [phone]  Fax:  [phone]

## 2020-10-06 NOTE — Telephone Encounter (Signed)
Refilled

## 2020-11-24 ENCOUNTER — Other Ambulatory Visit: Payer: Self-pay

## 2020-11-24 ENCOUNTER — Other Ambulatory Visit: Payer: Medicare PPO | Admitting: Internal Medicine

## 2020-11-24 DIAGNOSIS — M8580 Other specified disorders of bone density and structure, unspecified site: Secondary | ICD-10-CM | POA: Diagnosis not present

## 2020-11-24 DIAGNOSIS — I10 Essential (primary) hypertension: Secondary | ICD-10-CM

## 2020-11-24 DIAGNOSIS — E7800 Pure hypercholesterolemia, unspecified: Secondary | ICD-10-CM | POA: Diagnosis not present

## 2020-11-24 DIAGNOSIS — Z0000 Encounter for general adult medical examination without abnormal findings: Secondary | ICD-10-CM

## 2020-11-24 DIAGNOSIS — E039 Hypothyroidism, unspecified: Secondary | ICD-10-CM | POA: Diagnosis not present

## 2020-11-24 DIAGNOSIS — E785 Hyperlipidemia, unspecified: Secondary | ICD-10-CM

## 2020-11-24 LAB — CBC WITH DIFFERENTIAL/PLATELET
Absolute Monocytes: 585 cells/uL (ref 200–950)
Basophils Absolute: 31 cells/uL (ref 0–200)
Basophils Relative: 0.4 %
Eosinophils Absolute: 69 cells/uL (ref 15–500)
Eosinophils Relative: 0.9 %
HCT: 40.2 % (ref 35.0–45.0)
Hemoglobin: 13.5 g/dL (ref 11.7–15.5)
Lymphs Abs: 1709 cells/uL (ref 850–3900)
MCH: 31.8 pg (ref 27.0–33.0)
MCHC: 33.6 g/dL (ref 32.0–36.0)
MCV: 94.6 fL (ref 80.0–100.0)
MPV: 11.1 fL (ref 7.5–12.5)
Monocytes Relative: 7.6 %
Neutro Abs: 5305 cells/uL (ref 1500–7800)
Neutrophils Relative %: 68.9 %
Platelets: 242 10*3/uL (ref 140–400)
RBC: 4.25 10*6/uL (ref 3.80–5.10)
RDW: 12.1 % (ref 11.0–15.0)
Total Lymphocyte: 22.2 %
WBC: 7.7 10*3/uL (ref 3.8–10.8)

## 2020-11-24 LAB — COMPLETE METABOLIC PANEL WITH GFR
AG Ratio: 2.3 (calc) (ref 1.0–2.5)
ALT: 17 U/L (ref 6–29)
AST: 21 U/L (ref 10–35)
Albumin: 4.6 g/dL (ref 3.6–5.1)
Alkaline phosphatase (APISO): 51 U/L (ref 37–153)
BUN: 15 mg/dL (ref 7–25)
CO2: 28 mmol/L (ref 20–32)
Calcium: 9.3 mg/dL (ref 8.6–10.4)
Chloride: 104 mmol/L (ref 98–110)
Creat: 0.67 mg/dL (ref 0.60–0.93)
GFR, Est African American: 100 mL/min/{1.73_m2} (ref >=60)
GFR, Est Non African American: 87 mL/min/{1.73_m2} (ref >=60)
Globulin: 2 g/dL (calc) (ref 1.9–3.7)
Glucose, Bld: 99 mg/dL (ref 65–99)
Potassium: 4.6 mmol/L (ref 3.5–5.3)
Sodium: 140 mmol/L (ref 135–146)
Total Bilirubin: 0.6 mg/dL (ref 0.2–1.2)
Total Protein: 6.6 g/dL (ref 6.1–8.1)

## 2020-11-24 LAB — LIPID PANEL
Cholesterol: 211 mg/dL — ABNORMAL HIGH (ref <200)
HDL: 89 mg/dL (ref >=50)
LDL Cholesterol (Calc): 105 mg/dL (calc) — ABNORMAL HIGH
Non-HDL Cholesterol (Calc): 122 mg/dL (calc) (ref <130)
Total CHOL/HDL Ratio: 2.4 (calc) (ref <5.0)
Triglycerides: 84 mg/dL (ref <150)

## 2020-11-24 LAB — TSH: TSH: 1.25 mIU/L (ref 0.40–4.50)

## 2020-11-27 ENCOUNTER — Encounter: Payer: Self-pay | Admitting: Internal Medicine

## 2020-11-27 ENCOUNTER — Ambulatory Visit (INDEPENDENT_AMBULATORY_CARE_PROVIDER_SITE_OTHER): Payer: Medicare PPO | Admitting: Internal Medicine

## 2020-11-27 ENCOUNTER — Other Ambulatory Visit: Payer: Self-pay

## 2020-11-27 VITALS — BP 110/70 | HR 67 | Ht 65.0 in | Wt 142.0 lb

## 2020-11-27 DIAGNOSIS — Z0000 Encounter for general adult medical examination without abnormal findings: Secondary | ICD-10-CM | POA: Diagnosis not present

## 2020-11-27 DIAGNOSIS — I10 Essential (primary) hypertension: Secondary | ICD-10-CM

## 2020-11-27 DIAGNOSIS — M8580 Other specified disorders of bone density and structure, unspecified site: Secondary | ICD-10-CM

## 2020-11-27 DIAGNOSIS — E7800 Pure hypercholesterolemia, unspecified: Secondary | ICD-10-CM

## 2020-11-27 DIAGNOSIS — E039 Hypothyroidism, unspecified: Secondary | ICD-10-CM

## 2020-11-27 LAB — POCT URINALYSIS DIPSTICK
Appearance: NEGATIVE
Bilirubin, UA: NEGATIVE
Blood, UA: NEGATIVE
Glucose, UA: NEGATIVE
Ketones, UA: NEGATIVE
Leukocytes, UA: NEGATIVE
Nitrite, UA: NEGATIVE
Odor: NEGATIVE
Protein, UA: NEGATIVE
Spec Grav, UA: 1.01 (ref 1.010–1.025)
Urobilinogen, UA: 0.2 E.U./dL
pH, UA: 6.5 (ref 5.0–8.0)

## 2020-11-27 NOTE — Progress Notes (Signed)
Subjective:    Patient ID: Amanda Potts, female    DOB: 1946-02-05, 75 y.o.   MRN: [id number]  HPI  75 year old for health maintenance for  Medicare wellness, health maintenance exam and evaluation of medical issues.  History of essential hypertension, hypothyroidism, osteopenia.  Generally just seen annually for health maintenance exam.  She is allergic to Sulfa.  Past medical history: Bilateral cataract extractions.  She tried Actonel for osteopenia but it caused myalgias.  She tried Fosamax but it caused heartburn.  At this point she is just on vitamin D supplementation.  Social history: She is married.  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  Non-smoker.  Social alcohol consumption.  3 adult children.  Daughter resides in [place] and eldest son, Deidre Ala, lives here in [place].  She has several grandchildren.  Enjoys traveling.  Husband is retired.  Family history: Brother living in good health in his late 12s.  Father died at age 88 with dementia and stroke.  Mother died at age 52 with COPD and heart failure.  1 brother died at age 58 with lung cancer and was a smoker.  She has had 3 COVID-19 immunizations.  Tetanus immunization done in 2012. Will need update if injured.  Flu vaccine given November 2021.  Had Pneumococcal 23 vaccine in 2015.  Had colonoscopy in 2009.  Review of Systems No new complaints     Objective:   Physical Exam Blood pressure 110/70, pulse 67 regular, pulse oximetry 97% on room air.  BMI 23.63.  Height 5 feet 5 inches.  Skin is warm and dry.  No cervical adenopathy.  TMs are clear.  Neck is supple.  No carotid bruits.  No thyromegaly.  Chest is clear to auscultation without rales or wheezing.  Breast without masses.  Cardiac exam regular rate and rhythm normal S1 and S2 without murmurs.  Abdomen is soft nondistended without hepatosplenomegaly  masses or tenderness.  Pap is deferred due to age.  Bimanual normal.  No lower extremity pitting edema.  Neuro grossly intact without focal deficits.  Affect thought and judgment are normal.       Assessment & Plan:  Hypothyroidism-stable on thyroid replacement medication  History of osteopenia-intolerant of this phosphatase.  Just taking vitamin D supplement.  History of vitamin D deficiency-takes over-the-counter vitamin D.  Medicare does not pay for vitamin D testing.  This was not checked.  Essential hypertension stable on current regimen of metoprolol  Hyperlipidemia-treated with Crestor 5 mg twice weekly.  Total cholesterol is 211 and LDL cholesterol 105.  Last year total cholesterol was 230 with an LDL cholesterol of 109.  HDL declined just a bit from 100 to 89.  Triglycerides normal at 84.  Plan: Return in 1 year or as needed.   Subjective:   Patient presents for Medicare Annual/Subsequent preventive examination.  Review Past Medical/Family/Social: See above  Current exercise habits: routine exercise Dietary issues discussed: low fat  Cardiac risk factors: hyperlipidemia, Father with hx of stroke and mother with hx of heart failure  Depression Screen  (Note: if answer to either of the following is "Yes", a more complete depression screening is indicated)   Over the past two weeks, have you felt down, depressed or hopeless? No  Over the past two weeks, have you felt little interest or pleasure in doing things? No Have you lost interest or pleasure  in daily life? No Do you often feel hopeless? No Do you cry easily over simple problems? No   Activities of Daily Living  In your present state of health, do you have any difficulty performing the following activities?:   Driving? No  Managing money? No  Feeding yourself? No  Getting from bed to chair? No  Climbing a flight of stairs? No  Preparing food and eating?: No  Bathing or showering? No  Getting dressed: No   Getting to the toilet? No  Using the toilet:No  Moving around from place to place: No  In the past year have you fallen or had a near fall?:No  Are you sexually active? No  Do you have more than one partner? No   Hearing Difficulties: No  Do you often ask people to speak up or repeat themselves? No  Do you experience ringing or noises in your ears? No  Do you have difficulty understanding soft or whispered voices? No  Do you feel that you have a problem with memory? No Do you often misplace items? No    Home Safety:  Do you have a smoke alarm at your residence? Yes Do you have grab bars in the bathroom?yes Do you have throw rugs in your house?no   Cognitive Testing  Alert? Yes Normal Appearance?Yes  Oriented to person? Yes Place? Yes  Time? Yes  Recall of three objects? Yes  Can perform simple calculations? Yes  Displays appropriate judgment?Yes  Can read the correct time from a watch face?Yes   List the Names of Other Physician/Practitioners you currently use:  See referral list for the physicians patient is currently seeing.   No Sub- Specialities   Review of Systems: see above   Objective:     General appearance:pleasant in no distress Head: Normocephalic, without obvious abnormality, atraumatic  Eyes: conj clear, EOMi PEERLA  Ears: normal TM's and external ear canals both ears  Nose: Nares normal. Septum midline. Mucosa normal. No drainage or sinus tenderness.  Throat: lips, mucosa, and tongue normal; teeth and gums normal  Neck: no adenopathy, no carotid bruit, no JVD, supple, symmetrical, trachea midline and thyroid not enlarged, symmetric, no tenderness/mass/nodules  No CVA tenderness.  Lungs: clear to auscultation bilaterally  Breasts: normal appearance, no masses or tenderness Heart: regular rate and rhythm, S1, S2 normal, no murmur, click, rub or gallop  Abdomen: soft, non-tender; bowel sounds normal; no masses, no organomegaly  Musculoskeletal: ROM  normal in all joints, no crepitus, no deformity, Normal muscle strengthen. Back  is symmetric, no curvature. Skin: Skin color, texture, turgor normal. No rashes or lesions  Lymph nodes: Cervical, supraclavicular, and axillary nodes normal.  Neurologic: CN 2 -12 Normal, Normal symmetric reflexes. Normal coordination and gait  Psych: Alert & Oriented x 3, Mood appear stable.    Assessment:    Annual wellness medicare exam   Plan:    During the course of the visit the patient was educated and counseled about appropriate screening and preventive services including:   Annual mammogram-ordered  Annual flu vaccine  COVID vaccines are up-to-date  Bone density study ordered   Patient Instructions (the written plan) was given to the patient.  Medicare Attestation  I have personally reviewed:  The patient's medical and social history  Their use of alcohol, tobacco or illicit drugs  Their current medications and supplements  The patient's functional ability including ADLs,fall risks, home safety risks, cognitive, and hearing and visual impairment  Diet and physical activities  Evidence for depression or mood disorders  The patient's weight, height, BMI, and visual acuity have been recorded in the chart. I have made referrals, counseling, and provided education to the patient based on review of the above and I have provided the patient with a written personalized care plan for preventive services.

## 2020-12-02 ENCOUNTER — Other Ambulatory Visit: Payer: Self-pay | Admitting: Internal Medicine

## 2020-12-12 NOTE — Patient Instructions (Addendum)
It was a pleasure to see you today.  Continue current medications and follow-up in 1 year or as needed. Consider colon cancer screening. Mammogram and bone density study ordered.

## 2020-12-19 ENCOUNTER — Other Ambulatory Visit: Payer: Self-pay | Admitting: Internal Medicine

## 2021-02-05 ENCOUNTER — Other Ambulatory Visit: Payer: Self-pay | Admitting: Internal Medicine

## 2021-02-08 ENCOUNTER — Other Ambulatory Visit: Payer: Self-pay | Admitting: Internal Medicine

## 2021-02-08 MED ORDER — BETAMETHASONE DIPROPIONATE 0.05 % EX CREA
TOPICAL_CREAM | Freq: Two times a day (BID) | CUTANEOUS | 0 refills | Status: DC
Start: 2021-02-08 — End: 2021-02-08

## 2021-02-08 MED ORDER — LEVOTHYROXINE SODIUM 75 MCG PO TABS: 75.0000 ug | ORAL_TABLET | Freq: Every day | ORAL | 1 refills | Status: DC

## 2021-02-08 NOTE — Addendum Note (Signed)
Addended by: Mady Haagensen on: 02/08/2021 09:29 AM   Modules accepted: Orders

## 2021-02-09 DIAGNOSIS — H353131 Nonexudative age-related macular degeneration, bilateral, early dry stage: Secondary | ICD-10-CM | POA: Diagnosis not present

## 2021-03-23 DIAGNOSIS — H43813 Vitreous degeneration, bilateral: Secondary | ICD-10-CM | POA: Diagnosis not present

## 2021-03-23 DIAGNOSIS — H353133 Nonexudative age-related macular degeneration, bilateral, advanced atrophic without subfoveal involvement: Secondary | ICD-10-CM | POA: Diagnosis not present

## 2021-06-16 ENCOUNTER — Other Ambulatory Visit: Payer: Self-pay | Admitting: Internal Medicine

## 2021-07-05 ENCOUNTER — Encounter: Payer: Self-pay | Admitting: Gastroenterology

## 2021-07-05 DIAGNOSIS — H353131 Nonexudative age-related macular degeneration, bilateral, early dry stage: Secondary | ICD-10-CM | POA: Diagnosis not present

## 2021-08-03 ENCOUNTER — Telehealth: Payer: Self-pay

## 2021-08-03 ENCOUNTER — Other Ambulatory Visit: Payer: Self-pay

## 2021-08-03 ENCOUNTER — Ambulatory Visit (AMBULATORY_SURGERY_CENTER): Payer: Self-pay

## 2021-08-03 VITALS — Ht 65.0 in | Wt 143.0 lb

## 2021-08-03 DIAGNOSIS — Z1211 Encounter for screening for malignant neoplasm of colon: Secondary | ICD-10-CM

## 2021-08-03 NOTE — Telephone Encounter (Signed)
Patient may be offered a previsit if she likes to meet me prior to procedure, but based on overall health and chart review, I believe she can still be a direct colonoscopy. Please let me know what she decides. Thank you. GM

## 2021-08-03 NOTE — Progress Notes (Signed)
No allergies to soy or egg Pt is not on blood thinners or diet pills Denies issues with sedation/intubation Denies atrial flutter/fib Denies constipation   Emmi instructions given to pt  Pt is aware of Covid safety and care partner requirements.   Pt offered an appt to meet Dr. Rush Landmark.  Pt was good to be handled as an direct.  Dr. Rush Landmark was comfortable with a direct.  See TE 9/13

## 2021-08-03 NOTE — Telephone Encounter (Signed)
Dr. Rush Landmark:  Pt was content to come in as a direct.  Her last colonoscopy was normal-she brought in a picture/copy of her 2009 that notes each area was a normal colon.  I made a copy and have attached it to her face sheet to be scanned into the record.  Have a great day.

## 2021-08-03 NOTE — Telephone Encounter (Signed)
Morning Dr. Rush Landmark:  Pt  has a PV this morning and its noted she is 75.    PCP referred for screening colon.  Last colon was 2009  She appears healthy with main dx of HTN and Hypothyroidism.  Do you  want an office visit prior to procedure?  Procedure date 9/27.  Thank you

## 2021-08-04 ENCOUNTER — Other Ambulatory Visit: Payer: Self-pay | Admitting: Internal Medicine

## 2021-08-04 NOTE — Telephone Encounter (Signed)
Thank you for the update. I look forward to meeting her at her upcoming procedure. GM

## 2021-08-17 ENCOUNTER — Ambulatory Visit (AMBULATORY_SURGERY_CENTER): Payer: Medicare PPO | Admitting: Gastroenterology

## 2021-08-17 ENCOUNTER — Other Ambulatory Visit: Payer: Self-pay

## 2021-08-17 ENCOUNTER — Encounter: Payer: Self-pay | Admitting: Gastroenterology

## 2021-08-17 VITALS — BP 142/58 | HR 60 | Temp 97.8°F | Resp 12 | Ht 65.0 in | Wt 143.0 lb

## 2021-08-17 DIAGNOSIS — I10 Essential (primary) hypertension: Secondary | ICD-10-CM | POA: Diagnosis not present

## 2021-08-17 DIAGNOSIS — D124 Benign neoplasm of descending colon: Secondary | ICD-10-CM

## 2021-08-17 DIAGNOSIS — D125 Benign neoplasm of sigmoid colon: Secondary | ICD-10-CM

## 2021-08-17 DIAGNOSIS — D123 Benign neoplasm of transverse colon: Secondary | ICD-10-CM | POA: Diagnosis not present

## 2021-08-17 DIAGNOSIS — D120 Benign neoplasm of cecum: Secondary | ICD-10-CM | POA: Diagnosis not present

## 2021-08-17 DIAGNOSIS — Z1211 Encounter for screening for malignant neoplasm of colon: Secondary | ICD-10-CM | POA: Diagnosis not present

## 2021-08-17 DIAGNOSIS — D127 Benign neoplasm of rectosigmoid junction: Secondary | ICD-10-CM

## 2021-08-17 MED ORDER — SODIUM CHLORIDE 0.9 % IV SOLN
500.0000 mL | Freq: Once | INTRAVENOUS | Status: DC
Start: 2021-08-17 — End: 2021-08-17

## 2021-08-17 NOTE — Progress Notes (Signed)
A and O x3. Report to RN. Tolerated MAC anesthesia well. 

## 2021-08-17 NOTE — Progress Notes (Signed)
Called to room to assist during endoscopic procedure.  Patient ID and intended procedure confirmed with present staff. Received instructions for my participation in the procedure from the performing physician.  

## 2021-08-17 NOTE — Progress Notes (Signed)
Pt's states no medical or surgical changes since previsit or office visit.   Check-in-Linda  V/s-cw

## 2021-08-17 NOTE — Op Note (Signed)
[place] Patient Name: Amanda Potts Procedure Date: 08/17/2021 8:57 AM MRN: [id number] Endoscopist: Justice Britain , MD Age: 75 Referring MD:  Date of Birth: 1946-02-05 Gender: Female Account #: [id number] Procedure:                Colonoscopy Indications:              Screening for colorectal malignant neoplasm Medicines:                Monitored Anesthesia Care Procedure:                Pre-Anesthesia Assessment:                           - Prior to the procedure, a History and Physical                            was performed, and patient medications and                            allergies were reviewed. The patient's tolerance of                            previous anesthesia was also reviewed. The risks                            and benefits of the procedure and the sedation                            options and risks were discussed with the patient.                            All questions were answered, and informed consent                            was obtained. Prior Anticoagulants: The patient has                            taken no previous anticoagulant or antiplatelet                            agents. ASA Grade Assessment: II - A patient with                            mild systemic disease. After reviewing the risks                            and benefits, the patient was deemed in                            satisfactory condition to undergo the procedure.                           After obtaining informed consent, the colonoscope  was passed under direct vision. Throughout the                            procedure, the patient's blood pressure, pulse, and                            oxygen saturations were monitored continuously. The                            Olympus PCF-H190DL [id number]) Colonoscope was                            introduced through the anus and advanced to the the                            cecum,  identified by appendiceal orifice and                            ileocecal valve. The colonoscopy was performed                            without difficulty. The patient tolerated the                            procedure. The quality of the bowel preparation was                            good. The terminal ileum, ileocecal valve,                            appendiceal orifice, and rectum were photographed. Scope In: 9:08:00 AM Scope Out: 9:44:06 AM Scope Withdrawal Time: 0 hours 28 minutes 36 seconds  Total Procedure Duration: 0 hours 36 minutes 6 seconds  Findings:                 Skin tags were found on perianal exam.                           The digital rectal exam findings include                            hemorrhoids. Pertinent negatives include no                            palpable rectal lesions.                           14, sessile polyps were found in the recto-sigmoid                            colon (3), sigmoid colon (4), descending colon (1),                            transverse colon (5) and cecum (1). The polyps were  1 to 10 mm in size. These polyps were removed with                            a cold snare. Resection and retrieval were complete.                           A 20 mm polyp was found in the recto-sigmoid colon.                            The polyp was semi-pedunculated. Preparations were                            made for mucosal resection. NBI imaging and White                            light endoscopy was done to demarcate the borders                            of the lesion. Saline was injected to raise the                            lesion. Snare mucosal resection was performed.                            Resection and retrieval were complete.                           Multiple small and large-mouthed diverticula were                            found in the entire colon.                           Normal mucosa was found in  the entire colon                            otherwise.                           Non-bleeding non-thrombosed external and internal                            hemorrhoids were found during retroflexion, during                            perianal exam and during digital exam. The                            hemorrhoids were Grade II (internal hemorrhoids                            that prolapse but reduce spontaneously). Complications:            No immediate complications. Estimated Blood Loss:     Estimated blood  loss was minimal. Impression:               - Perianal skin tags found on perianal exam.                           - Hemorrhoids found on digital rectal exam.                           - 14, 1 to 10 mm polyps at the recto-sigmoid colon,                            in the sigmoid colon, in the descending colon, in                            the transverse colon and in the cecum, removed with                            a cold snare. Resected and retrieved.                           - One 20 mm polyp at the recto-sigmoid colon,                            removed with mucosal resection. Resected and                            retrieved.                           - Diverticulosis in the entire examined colon.                           - Normal mucosa in the entire examined colon                            otherwise.                           - Non-bleeding non-thrombosed external and internal                            hemorrhoids. Recommendation:           - The patient will be observed post-procedure,                            until all discharge criteria are met.                           - Discharge patient to home.                           - Patient has a contact number available for                            emergencies. The signs and symptoms  of potential                            delayed complications were discussed with the                            patient. Return to  normal activities tomorrow.                            Written discharge instructions were provided to the                            patient.                           - High fiber diet.                           - Use FiberCon 1-2 tablets PO daily.                           - Continue present medications.                           - No aspirin, ibuprofen, naproxen, or other                            non-steroidal anti-inflammatory drugs for 2 weeks                            after polyp removal.                           - Await pathology results.                           - Repeat colonoscopy in 1 year for surveillance                            based on pathology results if at least 10 polyps                            returns as adenomatous if not a 3-year follow up                            would be recommended.                           - The findings and recommendations were discussed                            with the patient.                           - The findings and recommendations were discussed  with the patient's family. Justice Britain, MD 08/17/2021 9:55:36 AM

## 2021-08-17 NOTE — Progress Notes (Signed)
GASTROENTEROLOGY PROCEDURE H&P NOTE   Primary Care Physician: Elby Showers, MD  HPI: Amanda Potts is a 75 y.o. female who presents for Colonoscopy for screening.  Past Medical History:  Diagnosis Date   Eczema    Hyperlipidemia    Hypertension    Osteopenia    Past Surgical History:  Procedure Laterality Date   EYE SURGERY  2010   cataract OS   Current Outpatient Medications  Medication Sig Dispense Refill   metoprolol succinate (TOPROL-XL) 25 MG 24 hr tablet TAKE 1 TABLET BY MOUTH EVERY DAY 90 tablet 1   SYNTHROID 75 MCG tablet TAKE 1 TABLET BY MOUTH EVERY DAY 90 tablet 1   B Complex Vitamins (B COMPLEX PO) Take by mouth.     betamethasone dipropionate 0.05 % cream APPLY TOPICALLY TWICE A DAY (Patient not taking: No sig reported) 30 g 0   calcium-vitamin D (OSCAL WITH D) 500-200 MG-UNIT per tablet Take 1 tablet by mouth daily.     Multiple Vitamins-Minerals (PRESERVISION AREDS PO) Take by mouth.     rosuvastatin (CRESTOR) 5 MG tablet TAKE ONE TAB BY MOUTH TWICE A WEEK FOR MILD HYPERLIPIDEMIA 26 tablet 3   Current Facility-Administered Medications  Medication Dose Route Frequency Provider Last Rate Last Admin   0.9 %  sodium chloride infusion  500 mL Intravenous Once Mansouraty, Telford Nab., MD        Current Outpatient Medications:    metoprolol succinate (TOPROL-XL) 25 MG 24 hr tablet, TAKE 1 TABLET BY MOUTH EVERY DAY, Disp: 90 tablet, Rfl: 1   SYNTHROID 75 MCG tablet, TAKE 1 TABLET BY MOUTH EVERY DAY, Disp: 90 tablet, Rfl: 1   B Complex Vitamins (B COMPLEX PO), Take by mouth., Disp: , Rfl:    betamethasone dipropionate 0.05 % cream, APPLY TOPICALLY TWICE A DAY (Patient not taking: No sig reported), Disp: 30 g, Rfl: 0   calcium-vitamin D (OSCAL WITH D) 500-200 MG-UNIT per tablet, Take 1 tablet by mouth daily., Disp: , Rfl:    Multiple Vitamins-Minerals (PRESERVISION AREDS PO), Take by mouth., Disp: , Rfl:    rosuvastatin (CRESTOR) 5 MG tablet, TAKE ONE TAB BY MOUTH  TWICE A WEEK FOR MILD HYPERLIPIDEMIA, Disp: 26 tablet, Rfl: 3  Current Facility-Administered Medications:    0.9 %  sodium chloride infusion, 500 mL, Intravenous, Once, Mansouraty, Telford Nab., MD Allergies  Allergen Reactions   Sulfa Antibiotics Hives   Family History  Problem Relation Age of Onset   Stroke Mother    COPD Father    Heart disease Father    Cancer Brother    Breast cancer Neg Hx    Colon cancer Neg Hx    Colon polyps Neg Hx    Esophageal cancer Neg Hx    Rectal cancer Neg Hx    Stomach cancer Neg Hx    Social History   Socioeconomic History   Marital status: Married    Spouse name: Not on file   Number of children: Not on file   Years of education: Not on file   Highest education level: Not on file  Occupational History   Not on file  Tobacco Use   Smoking status: Never   Smokeless tobacco: Never  Substance and Sexual Activity   Alcohol use: Yes    Comment: wine socially   Drug use: No   Sexual activity: Not on file  Other Topics Concern   Not on file  Social History Narrative   Not on file  Social Determinants of Health   Financial Resource Strain: Not on file  Food Insecurity: Not on file  Transportation Needs: Not on file  Physical Activity: Not on file  Stress: Not on file  Social Connections: Not on file  Intimate Partner Violence: Not on file    Physical Exam: Today's Vitals   08/17/21 0930 08/17/21 0935 08/17/21 0940 08/17/21 0946  BP: 135/74 128/68 113/68 126/66  Pulse: (!) 56 60 60 (!) 58  Resp: 13 12 13 12   Temp:      SpO2: 100% 100% 100%   Weight:      Height:       Body mass index is 23.8 kg/m. GEN: NAD EYE: Sclerae anicteric ENT: MMM CV: Non-tachycardic GI: Soft, NT/ND NEURO:  Alert & Oriented x 3  Lab Results: No results for input(s): WBC, HGB, HCT, PLT in the last 72 hours. BMET No results for input(s): NA, K, CL, CO2, GLUCOSE, BUN, CREATININE, CALCIUM in the last 72 hours. LFT No results for input(s):  PROT, ALBUMIN, AST, ALT, ALKPHOS, BILITOT, BILIDIR, IBILI in the last 72 hours. PT/INR No results for input(s): LABPROT, INR in the last 72 hours.   Impression / Plan: This is a 75 y.o.female who presents for Colonoscopy for screening.  The risks and benefits of endoscopic evaluation/treatment were discussed with the patient and/or family; these include but are not limited to the risk of perforation, infection, bleeding, missed lesions, lack of diagnosis, severe illness requiring hospitalization, as well as anesthesia and sedation related illnesses.  The patient's history has been reviewed, patient examined, no change in status, and deemed stable for procedure.  The patient and/or family is agreeable to proceed.    Justice Britain, MD [facility] Advanced [facility]oscopy Office # [phone]

## 2021-08-17 NOTE — Patient Instructions (Signed)
Handout on polyps, diverticulosis and high fiber given.  No aspirin, ibuprofen, naproxen, or other non-steroidal anti-inflammatory drugs for two weeks.     YOU HAD AN ENDOSCOPIC PROCEDURE TODAY AT [facility]:   Refer to the procedure report that was given to you for any specific questions about what was found during the examination.  If the procedure report does not answer your questions, please call your gastroenterologist to clarify.  If you requested that your care partner not be given the details of your procedure findings, then the procedure report has been included in a sealed envelope for you to review at your convenience later.  YOU SHOULD EXPECT: Some feelings of bloating in the abdomen. Passage of more gas than usual.  Walking can help get rid of the air that was put into your GI tract during the procedure and reduce the bloating. If you had a lower endoscopy (such as a colonoscopy or flexible sigmoidoscopy) you may notice spotting of blood in your stool or on the toilet paper. If you underwent a bowel prep for your procedure, you may not have a normal bowel movement for a few days.  Please Note:  You might notice some irritation and congestion in your nose or some drainage.  This is from the oxygen used during your procedure.  There is no need for concern and it should clear up in a day or so.  SYMPTOMS TO REPORT IMMEDIATELY:  Following lower endoscopy (colonoscopy or flexible sigmoidoscopy):  Excessive amounts of blood in the stool  Significant tenderness or worsening of abdominal pains  Swelling of the abdomen that is new, acute  Fever of 100F or higher   For urgent or emergent issues, a gastroenterologist can be reached at any hour by calling [phone]. Do not use MyChart messaging for urgent concerns.    DIET:  We do recommend a small meal at first, but then you may proceed to your regular diet.  Drink plenty of fluids but you should avoid alcoholic  beverages for 24 hours.  ACTIVITY:  You should plan to take it easy for the rest of today and you should NOT DRIVE or use heavy machinery until tomorrow (because of the sedation medicines used during the test).    FOLLOW UP: Our staff will call the number listed on your records 48-72 hours following your procedure to check on you and address any questions or concerns that you may have regarding the information given to you following your procedure. If we do not reach you, we will leave a message.  We will attempt to reach you two times.  During this call, we will ask if you have developed any symptoms of COVID 19. If you develop any symptoms (ie: fever, flu-like symptoms, shortness of breath, cough etc.) before then, please call [phone].  If you test positive for Covid 19 in the 2 weeks post procedure, please call and report this information to [place].    If any biopsies were taken you will be contacted by phone or by letter within the next 1-3 weeks.  Please call us at [phone] if you have not heard about the biopsies in 3 weeks.    SIGNATURES/CONFIDENTIALITY: You and/or your care partner have signed paperwork which will be entered into your electronic medical record.  These signatures attest to the fact that that the information above on your After Visit Summary has been reviewed and is understood.  Full responsibility of the confidentiality of this discharge information  lies with you and/or your care-partner.  

## 2021-08-19 ENCOUNTER — Telehealth: Payer: Self-pay

## 2021-08-19 NOTE — Telephone Encounter (Signed)
Left message on follow up call. 

## 2021-08-19 NOTE — Telephone Encounter (Signed)
  Follow up Call-  Call back number 08/17/2021  Post procedure Call Back phone  # [phone]  Permission to leave phone message Yes  Some recent data might be hidden     Patient questions:  Do you have a fever, pain , or abdominal swelling? No. Pain Score  0 *  Have you tolerated food without any problems? Yes.    Have you been able to return to your normal activities? Yes.    Do you have any questions about your discharge instructions: Diet   No. Medications  No. Follow up visit  No.  Do you have questions or concerns about your Care? No.  Actions: * If pain score is 4 or above: No action needed, pain <4.   Have you developed a fever since your procedure? no  2.   Have you had an respiratory symptoms (SOB or cough) since your procedure? no  3.   Have you tested positive for COVID 19 since your procedure no  4.   Have you had any family members/close contacts diagnosed with the COVID 19 since your procedure?  no   If yes to any of these questions please route to Joylene John, RN and Joella Prince, RN

## 2021-08-20 ENCOUNTER — Encounter: Payer: Self-pay | Admitting: Gastroenterology

## 2021-11-30 ENCOUNTER — Other Ambulatory Visit: Payer: Self-pay

## 2021-11-30 ENCOUNTER — Other Ambulatory Visit: Payer: Medicare Other | Admitting: Internal Medicine

## 2021-11-30 DIAGNOSIS — E039 Hypothyroidism, unspecified: Secondary | ICD-10-CM

## 2021-11-30 DIAGNOSIS — I10 Essential (primary) hypertension: Secondary | ICD-10-CM

## 2021-11-30 DIAGNOSIS — E7800 Pure hypercholesterolemia, unspecified: Secondary | ICD-10-CM

## 2021-11-30 DIAGNOSIS — M8580 Other specified disorders of bone density and structure, unspecified site: Secondary | ICD-10-CM

## 2021-11-30 LAB — COMPLETE METABOLIC PANEL WITH GFR
AG Ratio: 2 (calc) (ref 1.0–2.5)
ALT: 12 U/L (ref 6–29)
AST: 18 U/L (ref 10–35)
Albumin: 4.2 g/dL (ref 3.6–5.1)
Alkaline phosphatase (APISO): 46 U/L (ref 37–153)
BUN: 15 mg/dL (ref 7–25)
CO2: 29 mmol/L (ref 20–32)
Calcium: 9.2 mg/dL (ref 8.6–10.4)
Chloride: 103 mmol/L (ref 98–110)
Creat: 0.71 mg/dL (ref 0.60–1.00)
Globulin: 2.1 g/dL (calc) (ref 1.9–3.7)
Glucose, Bld: 112 mg/dL — ABNORMAL HIGH (ref 65–99)
Potassium: 5 mmol/L (ref 3.5–5.3)
Sodium: 138 mmol/L (ref 135–146)
Total Bilirubin: 0.6 mg/dL (ref 0.2–1.2)
Total Protein: 6.3 g/dL (ref 6.1–8.1)
eGFR: 89 mL/min/{1.73_m2} (ref >=60)

## 2021-11-30 LAB — CBC WITH DIFFERENTIAL/PLATELET
Absolute Monocytes: 592 cells/uL (ref 200–950)
Basophils Absolute: 41 cells/uL (ref 0–200)
Basophils Relative: 0.6 %
Eosinophils Absolute: 109 cells/uL (ref 15–500)
Eosinophils Relative: 1.6 %
HCT: 40.6 % (ref 35.0–45.0)
Hemoglobin: 13.7 g/dL (ref 11.7–15.5)
Lymphs Abs: 1707 cells/uL (ref 850–3900)
MCH: 32.2 pg (ref 27.0–33.0)
MCHC: 33.7 g/dL (ref 32.0–36.0)
MCV: 95.5 fL (ref 80.0–100.0)
MPV: 10.8 fL (ref 7.5–12.5)
Monocytes Relative: 8.7 %
Neutro Abs: 4352 cells/uL (ref 1500–7800)
Neutrophils Relative %: 64 %
Platelets: 252 10*3/uL (ref 140–400)
RBC: 4.25 10*6/uL (ref 3.80–5.10)
RDW: 12.4 % (ref 11.0–15.0)
Total Lymphocyte: 25.1 %
WBC: 6.8 10*3/uL (ref 3.8–10.8)

## 2021-11-30 LAB — LIPID PANEL
Cholesterol: 210 mg/dL — ABNORMAL HIGH (ref <200)
HDL: 86 mg/dL (ref >=50)
LDL Cholesterol (Calc): 106 mg/dL (calc) — ABNORMAL HIGH
Non-HDL Cholesterol (Calc): 124 mg/dL (calc) (ref <130)
Total CHOL/HDL Ratio: 2.4 (calc) (ref <5.0)
Triglycerides: 87 mg/dL (ref <150)

## 2021-11-30 LAB — TSH: TSH: 2.6 mIU/L (ref 0.40–4.50)

## 2021-11-30 LAB — VITAMIN D 25 HYDROXY (VIT D DEFICIENCY, FRACTURES): Vit D, 25-Hydroxy: 27 ng/mL — ABNORMAL LOW (ref 30–100)

## 2021-12-01 ENCOUNTER — Other Ambulatory Visit: Payer: Self-pay | Admitting: Internal Medicine

## 2021-12-01 DIAGNOSIS — Z1231 Encounter for screening mammogram for malignant neoplasm of breast: Secondary | ICD-10-CM

## 2021-12-03 ENCOUNTER — Other Ambulatory Visit: Payer: Self-pay

## 2021-12-03 ENCOUNTER — Encounter: Payer: Self-pay | Admitting: Internal Medicine

## 2021-12-03 ENCOUNTER — Ambulatory Visit (INDEPENDENT_AMBULATORY_CARE_PROVIDER_SITE_OTHER): Payer: Medicare Other | Admitting: Internal Medicine

## 2021-12-03 VITALS — BP 138/78 | HR 72 | Temp 98.0°F | Ht 65.5 in | Wt 145.0 lb

## 2021-12-03 DIAGNOSIS — R7301 Impaired fasting glucose: Secondary | ICD-10-CM

## 2021-12-03 DIAGNOSIS — Z0000 Encounter for general adult medical examination without abnormal findings: Secondary | ICD-10-CM

## 2021-12-03 DIAGNOSIS — M8580 Other specified disorders of bone density and structure, unspecified site: Secondary | ICD-10-CM | POA: Diagnosis not present

## 2021-12-03 DIAGNOSIS — E559 Vitamin D deficiency, unspecified: Secondary | ICD-10-CM

## 2021-12-03 DIAGNOSIS — R7302 Impaired glucose tolerance (oral): Secondary | ICD-10-CM | POA: Diagnosis not present

## 2021-12-03 DIAGNOSIS — I10 Essential (primary) hypertension: Secondary | ICD-10-CM | POA: Diagnosis not present

## 2021-12-03 DIAGNOSIS — E039 Hypothyroidism, unspecified: Secondary | ICD-10-CM

## 2021-12-03 DIAGNOSIS — E7800 Pure hypercholesterolemia, unspecified: Secondary | ICD-10-CM

## 2021-12-03 DIAGNOSIS — Z23 Encounter for immunization: Secondary | ICD-10-CM

## 2021-12-03 LAB — POCT URINALYSIS DIPSTICK
Bilirubin, UA: NEGATIVE
Blood, UA: NEGATIVE
Glucose, UA: NEGATIVE
Ketones, UA: NEGATIVE
Leukocytes, UA: NEGATIVE
Nitrite, UA: NEGATIVE
Protein, UA: NEGATIVE
Spec Grav, UA: 1.02 (ref 1.010–1.025)
Urobilinogen, UA: 0.2 E.U./dL
pH, UA: 6 (ref 5.0–8.0)

## 2021-12-03 MED ORDER — ERGOCALCIFEROL 1.25 MG (50000 UT) PO CAPS: 50000.0000 [IU] | ORAL_CAPSULE | ORAL | 3 refills | Status: DC

## 2021-12-03 NOTE — Progress Notes (Signed)
Annual Wellness Visit     Patient: Amanda Potts, Female    DOB: [birth year], 76 y.o.   MRN: [id number] Visit Date: 12/03/2021  Chief Complaint  Patient presents with   Medicare Wellness   Vitamin D deficiency   Subjective    GARA KINCADE is a 76 y.o. Female who presents today for her Annual Wellness Visit.  HPI  Mrs Crean is also seen for health maintenance exam and evaluation of medical issues.  She has a history of essential hypertension, hypothyroidism and osteopenia.  Generally just seen annually for health maintenance exam as she is healthy.  She is allergic to sulfa.  Past medical history: Bilateral cataract extractions.  She tried Actonel for osteopenia but it caused myalgias.  She tried Fosamax but it caused heartburn.  At this point she is just on vitamin D supplementation.  Social history: She is married.  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  Social alcohol consumption.  3 adult children.  Daughter resides in [place] and eldest son, Deidre Ala, lives here in [place].  She has several grandchildren.  Enjoys traveling.  Husband is retired.  Family history: Brother living in good health in his late 72s.  Father died at age 60 with dementia and stroke.  Mother died at age 70 with COPD and heart failure.  1 brother died at age 67 with lung cancer and was a smoker.  Had colonoscopy in 2009.        Social History   Social History Narrative   Social history: She is married.  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  Non-smoker.  Social alcohol consumption.  3 adult children.  Daughter resides in [place] and eldest son, Deidre Ala, lives here in [place].  She has several grandchildren.  Enjoys traveling.  Husband is retired.       Family history:  Brother living in good health in his late 64s.  Father died at age 78 with dementia and stroke.  Mother died at age 52 with COPD and heart failure.  1 brother died at age 56 with lung cancer and was a smoker.    Patient Care Team: Elby Showers, MD as PCP - General (Internal Medicine)  Review of Systems she feels well and has no new complaints   Objective    Vitals: BP 138/78    Pulse 72    Temp 98 F (36.7 C) (Tympanic)    Ht 5' 5.5" (1.664 m)    Wt 145 lb (65.8 kg)    SpO2 99%    BMI 23.76 kg/m   Physical Exam  Skin: Warm and dry.  Nodes none.  Neck is supple without JVD, thyromegaly or carotid bruits.  Chest is clear to auscultation without rales or wheezing.  Cardiac exam: Regular rate and rhythm without ectopy or murmurs.  Abdomen is soft nondistended without hepatosplenomegaly masses or tenderness.  Bimanual normal.  No lower extremity pitting edema.  Neurological exam is grossly intact without focal deficits.  Affect, thought, judgment are normal.   Most recent functional status assessment: In your present state of health, do you have any difficulty performing the following activities: 12/03/2021  Hearing? N  Vision? N  Difficulty concentrating or making decisions? N  Walking or climbing stairs? N  Dressing or bathing? N  Doing errands, shopping? N  Preparing Food and eating ? N  In the past six months, have you accidently leaked urine? N  Do you have problems with loss of bowel control? N  Managing your Medications? N  Managing your Finances? N  Housekeeping or managing your Housekeeping? N  Some recent data might be hidden   Most recent fall risk assessment: Fall Risk  12/03/2021  Falls in the past year? 0  Comment -  Number falls in past yr: 0  Injury with Fall? 0  Risk for fall due to : No Fall Risks  Follow up Falls evaluation completed    Most recent depression screenings: PHQ 2/9 Scores 12/03/2021 11/27/2020  PHQ - 2 Score 0 0   Most recent cognitive  screening: 6CIT Screen 12/03/2021  What Year? 0 points  What month? 0 points  What time? 0 points  Count back from 20 0 points  Months in reverse 0 points  Repeat phrase 0 points  Total Score 0       Assessment & Plan   Hypothyroidism-stable on current dose of thyroid replacement  Osteopenia-intolerant of bisphosphonate.  Currently just taking vitamin D supplement.  History of vitamin D deficiency-level is low normal at 27 and she will be placed on high-dose vitamin D weekly  Essential hypertension stable on metoprolol  Mild hyperlipidemia-recommend continue to take Crestor 5 mg twice weekly.  Currently she is taking this already and lipids are stable with total cholesterol of 210 and LDL cholesterol of 106.  HDL is excellent at 86.  Triglycerides are normal.    Annual wellness visit done today including the all of the following: Reviewed patient's Family Medical History Reviewed and updated list of patient's medical providers Assessment of cognitive impairment was done Assessed patient's functional ability Established a written schedule for health screening [place] Completed and Reviewed  Discussed health benefits of physical activity, and encouraged her to engage in regular exercise appropriate for her age and condition.     Recommend continuing current medications and return in 1 year or as needed.  Take high-dose vitamin D weekly.    IElby Showers, MD, have reviewed all documentation for this visit. The documentation on 12/13/21 for the exam, diagnosis, procedures, and orders are all accurate and complete.   Angus Seller, CMA

## 2021-12-03 NOTE — Patient Instructions (Addendum)
Take high dose Vitamin D weekly. Have colonoscopy this year and bone density study as well as annual mammogram. We are checking Hgb AIC since glucose was slightly elevated. Watch carbohydrates in diet. Continue to exercise. It was a pleasure to see you today. Continue thyroid replacement  medication,metoprolol, and Crestor.

## 2021-12-13 ENCOUNTER — Ambulatory Visit
Admission: RE | Admit: 2021-12-13 | Discharge: 2021-12-13 | Disposition: A | Discharge status: Home / Self Care | Payer: Medicare Other | Source: Ambulatory Visit | Attending: Internal Medicine | Admitting: Internal Medicine

## 2021-12-13 DIAGNOSIS — Z1231 Encounter for screening mammogram for malignant neoplasm of breast: Secondary | ICD-10-CM

## 2021-12-24 ENCOUNTER — Other Ambulatory Visit: Payer: Self-pay | Admitting: Internal Medicine

## 2022-01-01 ENCOUNTER — Encounter: Payer: Self-pay | Admitting: Internal Medicine

## 2022-01-03 NOTE — Telephone Encounter (Signed)
Called patient to check on and they were able to get Paxlvoid thru their son-in-law that is a doctor. So they are on their second day and are feeling better.

## 2022-02-02 ENCOUNTER — Other Ambulatory Visit: Payer: Self-pay | Admitting: Internal Medicine

## 2022-04-28 ENCOUNTER — Ambulatory Visit
Admission: RE | Admit: 2022-04-28 | Discharge: 2022-04-28 | Disposition: A | Discharge status: Home / Self Care | Payer: Medicare Other | Source: Ambulatory Visit | Attending: Internal Medicine | Admitting: Internal Medicine

## 2022-08-02 ENCOUNTER — Other Ambulatory Visit: Payer: Self-pay | Admitting: Internal Medicine

## 2022-11-01 ENCOUNTER — Other Ambulatory Visit: Payer: Self-pay | Admitting: Internal Medicine

## 2022-12-01 ENCOUNTER — Other Ambulatory Visit: Payer: Medicare Other

## 2022-12-01 DIAGNOSIS — E039 Hypothyroidism, unspecified: Secondary | ICD-10-CM

## 2022-12-01 DIAGNOSIS — R7302 Impaired glucose tolerance (oral): Secondary | ICD-10-CM

## 2022-12-01 DIAGNOSIS — E7800 Pure hypercholesterolemia, unspecified: Secondary | ICD-10-CM

## 2022-12-01 DIAGNOSIS — I10 Essential (primary) hypertension: Secondary | ICD-10-CM

## 2022-12-02 LAB — CBC WITH DIFFERENTIAL/PLATELET
Absolute Monocytes: 634 cells/uL (ref 200–950)
Basophils Absolute: 33 cells/uL (ref 0–200)
Basophils Relative: 0.5 %
Eosinophils Absolute: 119 cells/uL (ref 15–500)
Eosinophils Relative: 1.8 %
HCT: 41.8 % (ref 35.0–45.0)
Hemoglobin: 14.1 g/dL (ref 11.7–15.5)
Lymphs Abs: 1921 cells/uL (ref 850–3900)
MCH: 32.2 pg (ref 27.0–33.0)
MCHC: 33.7 g/dL (ref 32.0–36.0)
MCV: 95.4 fL (ref 80.0–100.0)
MPV: 10.8 fL (ref 7.5–12.5)
Monocytes Relative: 9.6 %
Neutro Abs: 3894 cells/uL (ref 1500–7800)
Neutrophils Relative %: 59 %
Platelets: 257 10*3/uL (ref 140–400)
RBC: 4.38 10*6/uL (ref 3.80–5.10)
RDW: 11.9 % (ref 11.0–15.0)
Total Lymphocyte: 29.1 %
WBC: 6.6 10*3/uL (ref 3.8–10.8)

## 2022-12-02 LAB — LIPID PANEL
Cholesterol: 228 mg/dL — ABNORMAL HIGH (ref <200)
HDL: 105 mg/dL (ref >=50)
LDL Cholesterol (Calc): 105 mg/dL (calc) — ABNORMAL HIGH
Non-HDL Cholesterol (Calc): 123 mg/dL (calc) (ref <130)
Total CHOL/HDL Ratio: 2.2 (calc) (ref <5.0)
Triglycerides: 89 mg/dL (ref <150)

## 2022-12-02 LAB — HEMOGLOBIN A1C
Hgb A1c MFr Bld: 5.6 % of total Hgb (ref <5.7)
Mean Plasma Glucose: 114 mg/dL
eAG (mmol/L): 6.3 mmol/L

## 2022-12-02 LAB — COMPLETE METABOLIC PANEL WITH GFR
AG Ratio: 1.7 (calc) (ref 1.0–2.5)
ALT: 13 U/L (ref 6–29)
AST: 17 U/L (ref 10–35)
Albumin: 4.2 g/dL (ref 3.6–5.1)
Alkaline phosphatase (APISO): 52 U/L (ref 37–153)
BUN: 19 mg/dL (ref 7–25)
CO2: 29 mmol/L (ref 20–32)
Calcium: 9.3 mg/dL (ref 8.6–10.4)
Chloride: 103 mmol/L (ref 98–110)
Creat: 0.71 mg/dL (ref 0.60–1.00)
Globulin: 2.5 g/dL (calc) (ref 1.9–3.7)
Glucose, Bld: 111 mg/dL — ABNORMAL HIGH (ref 65–99)
Potassium: 5.1 mmol/L (ref 3.5–5.3)
Sodium: 139 mmol/L (ref 135–146)
Total Bilirubin: 0.6 mg/dL (ref 0.2–1.2)
Total Protein: 6.7 g/dL (ref 6.1–8.1)
eGFR: 88 mL/min/{1.73_m2} (ref >=60)

## 2022-12-02 LAB — TSH: TSH: 2.03 mIU/L (ref 0.40–4.50)

## 2022-12-05 ENCOUNTER — Ambulatory Visit (INDEPENDENT_AMBULATORY_CARE_PROVIDER_SITE_OTHER): Payer: Medicare Other | Admitting: Internal Medicine

## 2022-12-05 ENCOUNTER — Encounter: Payer: Self-pay | Admitting: Internal Medicine

## 2022-12-05 VITALS — BP 132/84 | HR 77 | Temp 98.1°F | Ht 65.75 in | Wt 147.8 lb

## 2022-12-05 DIAGNOSIS — E559 Vitamin D deficiency, unspecified: Secondary | ICD-10-CM | POA: Diagnosis not present

## 2022-12-05 DIAGNOSIS — Z0000 Encounter for general adult medical examination without abnormal findings: Secondary | ICD-10-CM | POA: Diagnosis not present

## 2022-12-05 DIAGNOSIS — I10 Essential (primary) hypertension: Secondary | ICD-10-CM

## 2022-12-05 DIAGNOSIS — E7800 Pure hypercholesterolemia, unspecified: Secondary | ICD-10-CM | POA: Diagnosis not present

## 2022-12-05 DIAGNOSIS — E039 Hypothyroidism, unspecified: Secondary | ICD-10-CM | POA: Diagnosis not present

## 2022-12-05 DIAGNOSIS — M8580 Other specified disorders of bone density and structure, unspecified site: Secondary | ICD-10-CM

## 2022-12-05 LAB — POCT URINALYSIS DIPSTICK
Bilirubin, UA: NEGATIVE
Blood, UA: NEGATIVE
Glucose, UA: NEGATIVE
Ketones, UA: NEGATIVE
Leukocytes, UA: NEGATIVE
Nitrite, UA: NEGATIVE
Protein, UA: NEGATIVE
Spec Grav, UA: 1.015 (ref 1.010–1.025)
Urobilinogen, UA: 0.2 E.U./dL
pH, UA: 5 (ref 5.0–8.0)

## 2022-12-05 NOTE — Progress Notes (Deleted)
   Subjective:    Patient ID: Amanda Potts, female    DOB: [birth year], 77 y.o.   MRN: [id number]  HPI    Review of Systems     Objective:   Physical Exam        Assessment & Plan:

## 2022-12-05 NOTE — Progress Notes (Signed)
Annual Wellness Visit     Patient: Amanda Potts, Female    DOB: 09/19/1946, 77 y.o.   MRN: [id number] Visit Date: 12/05/2022   Subjective    Amanda Potts is a 77 y.o. female who presents today for her Annual Wellness Visit.  HPI She also is here for health maintenance exam and evaluation of medical issues.  She has a history of essential hypertension, hypothyroidism, osteopenia.  These are generally well-controlled with diet exercise and prescription medication.  She is generally just seen here annually.  She is allergic to Sulfa.  Labs reviewed today showed normal hemoglobin A1c, CBC, TSH.  She has a very high HDL of 105 which is excellent.  She exercises regularly.  LDL is 105.  Triglycerides normal at 89.  Fasting glucose is 111.  Electrolytes BUN and creatinine are normal.  Liver functions are normal.  TSH is normal.  Past medical history: Bilateral cataract extractions.  She tried Actonel for osteopenia but it caused myalgias and Fosamax caused heartburn.  At this point she is just on vitamin D supplement.  Bone density study June 2023 showed a T-score in left femur of -0.8.  This is very close to a normal score of 1.00.  Continue with vitamin D supplement and exercise.  Social history: She is married.  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  Social alcohol consumption.  3 adult children.  Daughter resides in [place].  Eldest son, Deidre Ala lives here in [place].  She has several grandchildren.  Husband is retired.  Patient enjoys traveling with her husband.  Family history: Brother living in good health in his late 89s.  Father died at age 105 with dementia and stroke.  Mother died at age 45 with COPD and heart failure.  1 brother died at age 29 with lung cancer and was a smoker.  She had colonoscopy in 2009 and in September 2022.  She had multiple polyps on  September 2022 colonoscopy including adenomatous polyps and hyperplastic polyps.  She will need repeat study soon.  This was discussed today.          Social History   Social History Narrative   Social history: She is married.  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  Non-smoker.  Social alcohol consumption.  3 adult children.  Daughter resides in [place] and eldest son, Deidre Ala, lives here in [place].  She has several grandchildren.  Enjoys traveling.  Husband is retired.       Family history: Brother living in good health in his late 44s.  Father died at age 73 with dementia and stroke.  Mother died at age 9 with COPD and heart failure.  1 brother died at age 13 with lung cancer and was a smoker.    Patient Care Team: Elby Showers, MD as PCP - General (Internal Medicine)  Review of Systems feels well and no new complaints   Objective    Vitals: BP 132/84   Pulse 77   Temp 98.1 F (36.7 C) (Tympanic)   Ht 5' 5.75" (1.67 m)   Wt 147 lb 12.8 oz (67 kg)   SpO2 99%   BMI 24.04 kg/m   Physical Exam Skin: Warm and dry.  No cervical adenopathy, thyromegaly or carotid  bruits.  TMs and pharynx clear.  Neck supple.  Chest clear.  Breasts without masses.  Cardiac exam: Regular rate and rhythm.  Abdomen soft nondistended without hepatosplenomegaly masses or tenderness.  Pap not done due to age.  Bimanual normal.  No lower extremity edema.  Brief neurological exam shows no focal deficits.  Her affect, thought, and judgment are entirely normal.  Most recent functional status assessment:    12/05/2022   11:07 AM  In your present state of health, do you have any difficulty performing the following activities:  Hearing? 0  Vision? 0  Difficulty concentrating or making decisions? 0  Walking or climbing stairs? 0  Dressing or bathing? 0  Doing errands, shopping? 0   Preparing Food and eating ? N  Using the Toilet? N  In the past six months, have you accidently leaked urine? N  Do you have problems with loss of bowel control? N  Managing your Medications? N  Managing your Finances? N  Housekeeping or managing your Housekeeping? N   Most recent fall risk assessment:    12/05/2022   11:05 AM  Fall Risk   Falls in the past year? 0  Number falls in past yr: 0  Injury with Fall? 0  Risk for fall due to : No Fall Risks  Follow up Falls prevention discussed    Most recent depression screenings:    12/05/2022   11:05 AM 12/03/2021    9:47 AM  PHQ 2/9 Scores  PHQ - 2 Score 0 0   Most recent cognitive screening:    12/05/2022   11:09 AM  6CIT Screen  What Year? 0 points  What month? 0 points  What time? 0 points  Count back from 20 0 points  Months in reverse 0 points  Repeat phrase 0 points  Total Score 0 points       Assessment & Plan   Normal health maintenance exam  Hypothyroidism-TSH stable on thyroid replacement medication  Vitamin D deficiency-take 50,000 units weekly.  Prescription sent in.  Level is 27.  She has mild elevation of total cholesterol at 210 but an excellent HDL at 86 and a very minimal elevation of LDL at 106.  She is agreeable to taking low-dose statin therapy and will be placed on Crestor 5 mg daily.  Mild osteopenia-intolerant of bisphosphonate and just taking vitamin D.  Essential hypertension stable on metoprolol  Plan: Return in 1 year or as needed.        Annual wellness visit done today including the all of the following: Reviewed patient's Family Medical History Reviewed and updated list of patient's medical providers Assessment of cognitive impairment was done Assessed patient's functional ability Established a written schedule for health screening [place] Completed and Reviewed  Discussed health benefits of physical activity, and encouraged her to engage in  regular exercise appropriate for her age and condition.         {I, Elby Showers, MD, have reviewed all documentation for this visit. The documentation on 12/07/22 for the exam, diagnosis, procedures, and orders are all accurate and complete.   LaVon Barron Alvine, CMA

## 2022-12-18 NOTE — Patient Instructions (Signed)
It is a pleasure to see you today.  Take vitamin D high-dose 50,000 units weekly by mouth.  You have very mild vitamin D deficiency.  You have very mild elevation of total cholesterol and LDL.  We are starting you on low-dose Crestor 5 mg daily.  Essential hypertension is stable on metoprolol.  Return in 1 year or as needed.

## 2023-01-21 ENCOUNTER — Other Ambulatory Visit: Payer: Self-pay | Admitting: Internal Medicine

## 2023-01-31 ENCOUNTER — Other Ambulatory Visit: Payer: Self-pay | Admitting: Internal Medicine

## 2023-01-31 DIAGNOSIS — Z1231 Encounter for screening mammogram for malignant neoplasm of breast: Secondary | ICD-10-CM

## 2023-03-21 ENCOUNTER — Ambulatory Visit
Admission: RE | Admit: 2023-03-21 | Discharge: 2023-03-21 | Disposition: A | Discharge status: Home / Self Care | Payer: Medicare Other | Source: Ambulatory Visit | Attending: Internal Medicine | Admitting: Internal Medicine

## 2023-03-21 DIAGNOSIS — Z1231 Encounter for screening mammogram for malignant neoplasm of breast: Secondary | ICD-10-CM

## 2023-07-26 ENCOUNTER — Encounter: Payer: Self-pay | Admitting: Gastroenterology

## 2023-07-26 ENCOUNTER — Ambulatory Visit: Payer: Medicare Other | Admitting: Gastroenterology

## 2023-07-26 ENCOUNTER — Other Ambulatory Visit: Payer: Self-pay | Admitting: Internal Medicine

## 2023-07-26 VITALS — BP 128/74 | HR 78 | Ht 66.0 in | Wt 146.0 lb

## 2023-07-26 DIAGNOSIS — Z86010 Personal history of colonic polyps: Secondary | ICD-10-CM

## 2023-07-26 DIAGNOSIS — Z1211 Encounter for screening for malignant neoplasm of colon: Secondary | ICD-10-CM

## 2023-07-26 MED ORDER — NA SULFATE-K SULFATE-MG SULF 17.5-3.13-1.6 GM/177ML PO SOLN: 1.0000 | ORAL | 0 refills | Status: DC

## 2023-07-26 NOTE — Patient Instructions (Signed)
You have been scheduled for a colonoscopy. Please follow written instructions given to you at your visit today.   Please pick up your prep supplies at the pharmacy within the next 1-3 days.  If you use inhalers (even only as needed), please bring them with you on the day of your procedure.  DO NOT TAKE 7 DAYS PRIOR TO TEST- Trulicity (dulaglutide) Ozempic, Wegovy (semaglutide) Mounjaro (tirzepatide) Bydureon Bcise (exanatide extended release)  DO NOT TAKE 1 DAY PRIOR TO YOUR TEST Rybelsus (semaglutide) Adlyxin (lixisenatide) Victoza (liraglutide) Byetta (exanatide) ___________________________________________________________________________  We have sent the following medications to your pharmacy for you to pick up at your convenience: Suprep   Due to recent changes in healthcare laws, you may see the results of your imaging and laboratory studies on MyChart before your provider has had a chance to review them.  We understand that in some cases there may be results that are confusing or concerning to you. Not all laboratory results come back in the same time frame and the provider may be waiting for multiple results in order to interpret others.  Please give [place] 48 hours in order for your provider to thoroughly review all the results before contacting the office for clarification of your results.   Thank you for choosing me and [facility].  Dr. Meridee Score

## 2023-07-26 NOTE — Progress Notes (Signed)
GASTROENTEROLOGY OUTPATIENT CLINIC VISIT   Primary Care Provider Margaree Mackintosh, MD [address] [place] [address] [phone]  Patient Profile: Amanda Potts is a 77 y.o. female with a pmh significant for hypertension, hyperlipidemia, hypothyroidism, osteopenia, eczema, colon polyps (TA's).  The patient presents to the [facility] for an evaluation and management of problem(s) noted below:  Problem List 1. Hx of adenomatous colonic polyps   2. Colon cancer screening     History of Present Illness This is a patient that I met in 2022 for a screening colonoscopy.  She was found to have greater than 10 precancerous adenomas as well as a larger advanced adenoma removed completely.  Patient is here today to discuss consideration of further colon polyp surveillance and colon cancer screening.  She is accompanied by her husband today.  Overall she continues to be relatively healthy for her age.  She has not had any changes in her bowel habits.  She has not noted any blood in her stools.  GI Review of Systems Positive as above Negative for pyrosis, dysphagia, odynophagia, nausea, vomiting, pain, alteration of bowel habits, melena, hematochezia  Review of Systems General: Denies fevers/chills/weight loss unintentionally Cardiovascular: Denies chest pain Pulmonary: Denies shortness of breath Gastroenterological: See HPI Genitourinary: Denies darkened urine Hematological: Denies easy bruising/bleeding Dermatological: Denies jaundice Psychological: Mood is stable   Medications Current Outpatient Medications  Medication Sig Dispense Refill   B Complex Vitamins (B COMPLEX PO) Take by mouth.     betamethasone dipropionate 0.05 % cream APPLY TOPICALLY TWICE A DAY 30 g 0   metoprolol succinate (TOPROL-XL) 25 MG 24 hr tablet TAKE 1 TABLET BY MOUTH EVERY DAY 90 tablet 1   Na Sulfate-K Sulfate-Mg Sulf (SUPREP BOWEL PREP KIT) 17.5-3.13-1.6 GM/177ML SOLN Take  1 kit by mouth as directed. For colonoscopy prep 354 mL 0   rosuvastatin (CRESTOR) 5 MG tablet TAKE ONE TAB BY MOUTH TWICE A WEEK FOR MILD HYPERLIPIDEMIA 26 tablet 3   Vitamin D, Ergocalciferol, (DRISDOL) 1.25 MG (50000 UNIT) CAPS capsule TAKE 1 CAPSULE BY MOUTH ONE TIME PER WEEK 12 capsule 3   SYNTHROID 75 MCG tablet TAKE 1 TABLET BY MOUTH EVERY DAY 90 tablet 1   No current facility-administered medications for this visit.    Allergies Allergies  Allergen Reactions   Sulfa Antibiotics Hives    Histories Past Medical History:  Diagnosis Date   Eczema    Hyperlipidemia    Hypertension    Osteopenia    Past Surgical History:  Procedure Laterality Date   EYE SURGERY  2010   cataract OS   Social History   Socioeconomic History   Marital status: Married    Spouse name: Not on file   Number of children: Not on file   Years of education: Not on file   Highest education level: Not on file  Occupational History   Not on file  Tobacco Use   Smoking status: Never   Smokeless tobacco: Never  Vaping Use   Vaping status: Never Used  Substance and Sexual Activity   Alcohol use: Yes    Comment: wine socially   Drug use: No   Sexual activity: Not on file  Other Topics Concern   Not on file  Social History Narrative   Social history: She is married.  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  Non-smoker.  Social alcohol consumption.  3 adult children.  Daughter resides in [place] and eldest son, Rayna Sexton, lives here in [place].  She has several grandchildren.  Enjoys traveling.  Husband is retired.       Family history: Brother living in good health in his late 23s.  Father died at age 62 with dementia and stroke.  Mother died at age 61 with COPD and heart failure.  1 brother died at age 93 with lung cancer and was a smoker.   Social Determinants of Health   Financial  Resource Strain: Not on file  Food Insecurity: Not on file  Transportation Needs: Not on file  Physical Activity: Not on file  Stress: Not on file  Social Connections: Not on file  Intimate Partner Violence: Not on file   Family History  Problem Relation Age of Onset   Stroke Mother    COPD Father    Heart disease Father    Cancer Brother    Breast cancer Neg Hx    Colon cancer Neg Hx    Colon polyps Neg Hx    Esophageal cancer Neg Hx    Rectal cancer Neg Hx    Stomach cancer Neg Hx    Inflammatory bowel disease Neg Hx    Liver disease Neg Hx    Pancreatic cancer Neg Hx    I have reviewed her medical, social, and family history in detail and updated the electronic medical record as necessary.    PHYSICAL EXAMINATION  BP 128/74   Pulse 78   Ht 5\' 6"  (1.676 m)   Wt 146 lb (66.2 kg)   SpO2 99%   BMI 23.57 kg/m  Wt Readings from Last 3 Encounters:  07/26/23 146 lb (66.2 kg)  12/05/22 147 lb 12.8 oz (67 kg)  12/03/21 145 lb (65.8 kg)  GEN: NAD, appears younger than stated age, doesn't appear chronically ill PSYCH: Cooperative, without pressured speech EYE: Conjunctivae pink, sclerae anicteric ENT: MMM CV: Nontachycardic RESP: No audible wheezing GI: NABS, soft, NT/ND, without rebound or guarding MSK/EXT: No significant lower extremity edema SKIN: No jaundice NEURO:  Alert & Oriented x 3, no focal deficits   REVIEW OF DATA  I reviewed the following data at the time of this encounter:  GI Procedures and Studies  2022 colonoscopy - Perianal skin tags found on perianal exam. - Hemorrhoids found on digital rectal exam. - 14, 1 to 10 mm polyps at the recto-sigmoid colon, in the sigmoid colon, in the descending colon, in the transverse colon and in the cecum, removed with a cold snare. Resected and retrieved. - One 20 mm polyp at the recto-sigmoid colon, removed with mucosal resection. Resected and retrieved. - Diverticulosis in the entire examined colon. - Normal mucosa  in the entire examined colon otherwise. - Non-bleeding non-thrombosed external and internal hemorrhoids  Laboratory Studies  Reviewed those in epic  Imaging Studies  No relevant studies to review   ASSESSMENT  Ms. Clarence is a 77 y.o. female with a pmh significant for hypertension, hyperlipidemia, hypothyroidism, osteopenia, eczema, colon polyps (TA's).  The patient is seen today for evaluation and management of:  1. Hx of adenomatous colonic polyps   2. Colon cancer screening    The patient is hemodynamically and clinically stable at this time.  The patient remains an adequate candidate based on her overall health and projected health expectancy and I think she would benefit from at least 1 more colonoscopy for surveillance purposes and screening purposes.  The number  of polyps that she had she was actually due last year for repeat colonoscopy.  At this point we discussed multiple potential options of either stopping colon cancer screening versus pursuing colonoscopy versus considering Cologuard (which is not ideal in a patient who is considered increased/high risk based on the types of polyps and number of polyps that she has had previously).  The risks and benefits of endoscopic evaluation were discussed with the patient; these include but are not limited to the risk of perforation, infection, bleeding, missed lesions, lack of diagnosis, severe illness requiring hospitalization, as well as anesthesia and sedation related illnesses.  The patient and/or family is agreeable to proceed with colonoscopy for surveillance.  She will be scheduled as able.  All patient questions were answered to the best of my ability, and the patient agrees to the aforementioned plan of action with follow-up as indicated.   PLAN  Proceed with scheduling colonoscopy for colon polyp surveillance   Orders Placed This Encounter  Procedures   Ambulatory referral to Gastroenterology    New Prescriptions   NA SULFATE-K  SULFATE-MG SULF (SUPREP BOWEL PREP KIT) 17.5-3.13-1.6 GM/177ML SOLN    Take 1 kit by mouth as directed. For colonoscopy prep    Planned Follow Up No follow-ups on file.   Total Time in Face-to-Face and in Coordination of Care for patient including independent/personal interpretation/review of prior testing, medical history, examination, medication adjustment, communicating results with the patient directly, and documentation within the EHR is 25 minutes.   Corliss Parish, MD  Gastroenterology Advanced Endoscopy Office # [phone]

## 2023-07-30 ENCOUNTER — Encounter: Payer: Self-pay | Admitting: Gastroenterology

## 2023-07-30 DIAGNOSIS — Z1211 Encounter for screening for malignant neoplasm of colon: Secondary | ICD-10-CM | POA: Insufficient documentation

## 2023-09-11 ENCOUNTER — Encounter: Payer: Self-pay | Admitting: Gastroenterology

## 2023-09-12 ENCOUNTER — Telehealth: Payer: Self-pay | Admitting: Internal Medicine

## 2023-09-12 ENCOUNTER — Encounter: Payer: Self-pay | Admitting: Internal Medicine

## 2023-09-12 DIAGNOSIS — Z7184 Encounter for health counseling related to travel: Secondary | ICD-10-CM

## 2023-09-12 MED ORDER — LEVOFLOXACIN 500 MG PO TABS: 500.0000 mg | ORAL_TABLET | Freq: Every day | ORAL | 0 refills | Status: AC

## 2023-09-12 NOTE — Telephone Encounter (Signed)
Going to [place] in January and would like med for travel. Sending in Levaquin 500 mg daily for 7 days. Take with food. This will treat pneumonia and urinary infections.

## 2023-09-26 ENCOUNTER — Ambulatory Visit: Payer: Medicare Other | Admitting: Gastroenterology

## 2023-09-26 ENCOUNTER — Encounter: Payer: Self-pay | Admitting: Gastroenterology

## 2023-09-26 VITALS — BP 129/75 | HR 62 | Temp 97.7°F | Resp 9 | Ht 66.0 in | Wt 146.0 lb

## 2023-09-26 DIAGNOSIS — K5730 Diverticulosis of large intestine without perforation or abscess without bleeding: Secondary | ICD-10-CM

## 2023-09-26 DIAGNOSIS — Z860101 Personal history of adenomatous and serrated colon polyps: Secondary | ICD-10-CM | POA: Diagnosis not present

## 2023-09-26 DIAGNOSIS — D123 Benign neoplasm of transverse colon: Secondary | ICD-10-CM

## 2023-09-26 DIAGNOSIS — Z09 Encounter for follow-up examination after completed treatment for conditions other than malignant neoplasm: Secondary | ICD-10-CM

## 2023-09-26 DIAGNOSIS — K635 Polyp of colon: Secondary | ICD-10-CM | POA: Diagnosis not present

## 2023-09-26 DIAGNOSIS — D124 Benign neoplasm of descending colon: Secondary | ICD-10-CM | POA: Diagnosis not present

## 2023-09-26 DIAGNOSIS — K649 Unspecified hemorrhoids: Secondary | ICD-10-CM

## 2023-09-26 DIAGNOSIS — D122 Benign neoplasm of ascending colon: Secondary | ICD-10-CM

## 2023-09-26 MED ORDER — SODIUM CHLORIDE 0.9 % IV SOLN: 500.0000 mL | INTRAVENOUS | Status: DC

## 2023-09-26 NOTE — Progress Notes (Signed)
Called to room to assist during endoscopic procedure.  Patient ID and intended procedure confirmed with present staff. Received instructions for my participation in the procedure from the performing physician.  

## 2023-09-26 NOTE — Patient Instructions (Signed)
Handouts Provided:  Polyps, Diverticulosis and High Fiber Diet  Use FiberCon 1-2 Tablets by mouth daily.  YOU HAD AN ENDOSCOPIC PROCEDURE TODAY AT THE [facility]:   Refer to the procedure report that was given to you for any specific questions about what was found during the examination.  If the procedure report does not answer your questions, please call your gastroenterologist to clarify.  If you requested that your care partner not be given the details of your procedure findings, then the procedure report has been included in a sealed envelope for you to review at your convenience later.  YOU SHOULD EXPECT: Some feelings of bloating in the abdomen. Passage of more gas than usual.  Walking can help get rid of the air that was put into your GI tract during the procedure and reduce the bloating. If you had a lower endoscopy (such as a colonoscopy or flexible sigmoidoscopy) you may notice spotting of blood in your stool or on the toilet paper. If you underwent a bowel prep for your procedure, you may not have a normal bowel movement for a few days.  Please Note:  You might notice some irritation and congestion in your nose or some drainage.  This is from the oxygen used during your procedure.  There is no need for concern and it should clear up in a day or so.  SYMPTOMS TO REPORT IMMEDIATELY:  Following lower endoscopy (colonoscopy or flexible sigmoidoscopy):  Excessive amounts of blood in the stool  Significant tenderness or worsening of abdominal pains  Swelling of the abdomen that is new, acute  Fever of 100F or higher  For urgent or emergent issues, a gastroenterologist can be reached at any hour by calling [phone]. Do not use MyChart messaging for urgent concerns.    DIET:  We do recommend a small meal at first, but then you may proceed to your regular diet.  Drink plenty of fluids but you should avoid alcoholic beverages for 24 hours.  ACTIVITY:  You should plan to  take it easy for the rest of today and you should NOT DRIVE or use heavy machinery until tomorrow (because of the sedation medicines used during the test).    FOLLOW UP: Our staff will call the number listed on your records the next business day following your procedure.  We will call around 7:15- 8:00 am to check on you and address any questions or concerns that you may have regarding the information given to you following your procedure. If we do not reach you, we will leave a message.     If any biopsies were taken you will be contacted by phone or by letter within the next 1-3 weeks.  Please call us at [phone] if you have not heard about the biopsies in 3 weeks.    SIGNATURES/CONFIDENTIALITY: You and/or your care partner have signed paperwork which will be entered into your electronic medical record.  These signatures attest to the fact that that the information above on your After Visit Summary has been reviewed and is understood.  Full responsibility of the confidentiality of this discharge information lies with you and/or your care-partner.

## 2023-09-26 NOTE — Progress Notes (Signed)
GASTROENTEROLOGY PROCEDURE H&P NOTE   Primary Care Physician: Margaree Mackintosh, MD  HPI: Amanda Potts is a 77 y.o. female who presents for Colonoscopy for surveillance.  Past Medical History:  Diagnosis Date   Cataract    Eczema    Hyperlipidemia    Hypertension    Osteopenia    Past Surgical History:  Procedure Laterality Date   EYE SURGERY  2010   cataract OS   Current Outpatient Medications  Medication Sig Dispense Refill   B Complex Vitamins (B COMPLEX PO) Take by mouth.     metoprolol succinate (TOPROL-XL) 25 MG 24 hr tablet TAKE 1 TABLET BY MOUTH EVERY DAY 90 tablet 1   SYNTHROID 75 MCG tablet TAKE 1 TABLET BY MOUTH EVERY DAY 90 tablet 1   betamethasone dipropionate 0.05 % cream APPLY TOPICALLY TWICE A DAY 30 g 0   rosuvastatin (CRESTOR) 5 MG tablet TAKE ONE TAB BY MOUTH TWICE A WEEK FOR MILD HYPERLIPIDEMIA 26 tablet 3   Vitamin D, Ergocalciferol, (DRISDOL) 1.25 MG (50000 UNIT) CAPS capsule TAKE 1 CAPSULE BY MOUTH ONE TIME PER WEEK 12 capsule 3   Current Facility-Administered Medications  Medication Dose Route Frequency Provider Last Rate Last Admin   0.9 %  sodium chloride infusion  500 mL Intravenous Continuous Mansouraty, Netty Starring., MD        Current Outpatient Medications:    B Complex Vitamins (B COMPLEX PO), Take by mouth., Disp: , Rfl:    metoprolol succinate (TOPROL-XL) 25 MG 24 hr tablet, TAKE 1 TABLET BY MOUTH EVERY DAY, Disp: 90 tablet, Rfl: 1   SYNTHROID 75 MCG tablet, TAKE 1 TABLET BY MOUTH EVERY DAY, Disp: 90 tablet, Rfl: 1   betamethasone dipropionate 0.05 % cream, APPLY TOPICALLY TWICE A DAY, Disp: 30 g, Rfl: 0   rosuvastatin (CRESTOR) 5 MG tablet, TAKE ONE TAB BY MOUTH TWICE A WEEK FOR MILD HYPERLIPIDEMIA, Disp: 26 tablet, Rfl: 3   Vitamin D, Ergocalciferol, (DRISDOL) 1.25 MG (50000 UNIT) CAPS capsule, TAKE 1 CAPSULE BY MOUTH ONE TIME PER WEEK, Disp: 12 capsule, Rfl: 3  Current Facility-Administered Medications:    0.9 %  sodium chloride  infusion, 500 mL, Intravenous, Continuous, Mansouraty, Netty Starring., MD Allergies  Allergen Reactions   Sulfa Antibiotics Hives   Family History  Problem Relation Age of Onset   Stroke Mother    COPD Father    Heart disease Father    Cancer Brother    Breast cancer Neg Hx    Colon cancer Neg Hx    Colon polyps Neg Hx    Esophageal cancer Neg Hx    Rectal cancer Neg Hx    Stomach cancer Neg Hx    Inflammatory bowel disease Neg Hx    Liver disease Neg Hx    Pancreatic cancer Neg Hx    Social History   Socioeconomic History   Marital status: Married    Spouse name: Not on file   Number of children: Not on file   Years of education: Not on file   Highest education level: Not on file  Occupational History   Not on file  Tobacco Use   Smoking status: Never   Smokeless tobacco: Never  Vaping Use   Vaping status: Never Used  Substance and Sexual Activity   Alcohol use: Yes    Comment: wine socially   Drug use: No   Sexual activity: Not on file  Other Topics Concern   Not on file  Social History  Narrative   Social history: She is married.  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  Non-smoker.  Social alcohol consumption.  3 adult children.  Daughter resides in [place] and eldest son, Rayna Sexton, lives here in [place].  She has several grandchildren.  Enjoys traveling.  Husband is retired.       Family history: Brother living in good health in his late 52s.  Father died at age 33 with dementia and stroke.  Mother died at age 52 with COPD and heart failure.  1 brother died at age 91 with lung cancer and was a smoker.   Social Determinants of Health   Financial Resource Strain: Not on file  Food Insecurity: Not on file  Transportation Needs: Not on file  Physical Activity: Not on file  Stress: Not on file  Social Connections: Not on file  Intimate Partner Violence:  Not on file    Physical Exam: Today's Vitals   09/26/23 1250  BP: (!) 144/62  Pulse: 63  Temp: 97.7 F (36.5 C)  TempSrc: Temporal  SpO2: 100%  Weight: 146 lb (66.2 kg)  Height: 5\' 6"  (1.676 m)   Body mass index is 23.57 kg/m. GEN: NAD EYE: Sclerae anicteric ENT: MMM CV: Non-tachycardic GI: Soft, NT/ND NEURO:  Alert & Oriented x 3  Lab Results: No results for input(s): "WBC", "HGB", "HCT", "PLT" in the last 72 hours. BMET No results for input(s): "NA", "K", "CL", "CO2", "GLUCOSE", "BUN", "CREATININE", "CALCIUM" in the last 72 hours. LFT No results for input(s): "PROT", "ALBUMIN", "AST", "ALT", "ALKPHOS", "BILITOT", "BILIDIR", "IBILI" in the last 72 hours. PT/INR No results for input(s): "LABPROT", "INR" in the last 72 hours.   Impression / Plan: This is a 77 y.o.female who presents for Colonoscopy for surveillance.   The risks and benefits of endoscopic evaluation/treatment were discussed with the patient and/or family; these include but are not limited to the risk of perforation, infection, bleeding, missed lesions, lack of diagnosis, severe illness requiring hospitalization, as well as anesthesia and sedation related illnesses.  The patient's history has been reviewed, patient examined, no change in status, and deemed stable for procedure.  The patient and/or family is agreeable to proceed.    Corliss Parish, MD [facility] Advanced [facility]oscopy Office # [phone]

## 2023-09-26 NOTE — Progress Notes (Signed)
Vss nad trans to pacu 

## 2023-09-26 NOTE — Op Note (Signed)
[facility] Patient Name: Amanda Potts Procedure Date: 09/26/2023 1:44 PM MRN: [id number] Endoscopist: Corliss Parish , MD, [id number] Age: 77 Referring MD:  Date of Birth: [birth year] Gender: Female Account #: [id number] Procedure:                Colonoscopy Indications:              Surveillance: History of numerous (> 10) adenomas                            on last colonoscopy (1 year ago) Medicines:                Monitored Anesthesia Care Procedure:                Pre-Anesthesia Assessment:                           - Prior to the procedure, a History and Physical                            was performed, and patient medications and                            allergies were reviewed. The patient's tolerance of                            previous anesthesia was also reviewed. The risks                            and benefits of the procedure and the sedation                            options and risks were discussed with the patient.                            All questions were answered, and informed consent                            was obtained. Prior Anticoagulants: The patient has                            taken no anticoagulant or antiplatelet agents. ASA                            Grade Assessment: II - A patient with mild systemic                            disease. After reviewing the risks and benefits,                            the patient was deemed in satisfactory condition to                            undergo the procedure.  After obtaining informed consent, the colonoscope                            was passed under direct vision. Throughout the                            procedure, the patient's blood pressure, pulse, and                            oxygen saturations were monitored continuously. The                            Olympus Scope SN: [id number] was introduced through                            the anus and advanced  to the the cecum, identified                            by appendiceal orifice and ileocecal valve. The                            colonoscopy was performed without difficulty. The                            patient tolerated the procedure. The quality of the                            bowel preparation was good. The ileocecal valve,                            appendiceal orifice, and rectum were photographed. Scope In: 1:49:30 PM Scope Out: 2:08:39 PM Scope Withdrawal Time: 0 hours 12 minutes 4 seconds  Total Procedure Duration: 0 hours 19 minutes 9 seconds  Findings:                 The digital rectal exam findings include                            hemorrhoids. Pertinent negatives include no                            palpable rectal lesions.                           The colon (entire examined portion) revealed                            moderately excessive looping.                           Eight sessile polyps were found in the descending                            colon (3), transverse colon (3) and ascending colon                            (  2). The polyps were 1 to 7 mm in size. These                            polyps were removed with a cold snare. Resection                            and retrieval were complete.                           Multiple small-mouthed diverticula were found in                            the recto-sigmoid colon and sigmoid colon.                           Normal mucosa was found in the entire colon                            otherwise.                           Non-bleeding non-thrombosed external and internal                            hemorrhoids were found during retroflexion, during                            perianal exam and during digital exam. The                            hemorrhoids were Grade II (internal hemorrhoids                            that prolapse but reduce spontaneously). Complications:            No immediate  complications. Estimated Blood Loss:     Estimated blood loss was minimal. Impression:               - Hemorrhoids found on digital rectal exam.                           - There was significant looping of the colon.                           - Eight, 1 to 7 mm polyps in the descending colon,                            in the transverse colon and in the ascending colon,                            removed with a cold snare. Resected and retrieved.                           - Diverticulosis in the recto-sigmoid colon and in  the sigmoid colon.                           - Normal mucosa in the entire examined colon                            otherwise.                           - Non-bleeding non-thrombosed external and internal                            hemorrhoids. Recommendation:           - The patient will be observed post-procedure,                            until all discharge criteria are met.                           - Discharge patient to home.                           - Patient has a contact number available for                            emergencies. The signs and symptoms of potential                            delayed complications were discussed with the                            patient. Return to normal activities tomorrow.                            Written discharge instructions were provided to the                            patient.                           - High fiber diet.                           - Use FiberCon 1-2 tablets PO daily.                           - Continue present medications.                           - Await pathology results.                           - Repeat colonoscopy in 3 - 5 years for                            surveillance based on pathology results (also based  on patient's other medical comorbidities at the                            time as she will be 77 years of age).                            - The findings and recommendations were discussed                            with the patient.                           - The findings and recommendations were discussed                            with the patient's family. Corliss Parish, MD 09/26/2023 2:14:55 PM

## 2023-09-27 ENCOUNTER — Telehealth: Payer: Self-pay

## 2023-09-27 NOTE — Telephone Encounter (Signed)
  Follow up Call-     09/26/2023   12:50 PM 08/17/2021    8:37 AM  Call back number  Post procedure Call Back phone  # [phone]  Permission to leave phone message Yes Yes     Patient questions:  Do you have a fever, pain , or abdominal swelling? No. Pain Score  0 *  Have you tolerated food without any problems? Yes.    Have you been able to return to your normal activities? Yes.    Do you have any questions about your discharge instructions: Diet   No. Medications  No. Follow up visit  No.  Do you have questions or concerns about your Care? No.  Actions: * If pain score is 4 or above: No action needed, pain <4.

## 2023-09-28 ENCOUNTER — Other Ambulatory Visit: Payer: Self-pay | Admitting: Internal Medicine

## 2023-09-29 LAB — SURGICAL PATHOLOGY

## 2023-10-01 ENCOUNTER — Encounter: Payer: Self-pay | Admitting: Gastroenterology

## 2023-11-27 ENCOUNTER — Telehealth: Payer: Self-pay | Admitting: Internal Medicine

## 2023-11-27 ENCOUNTER — Encounter: Payer: Self-pay | Admitting: Internal Medicine

## 2023-11-27 ENCOUNTER — Telehealth (INDEPENDENT_AMBULATORY_CARE_PROVIDER_SITE_OTHER): Payer: Medicare Other | Admitting: Internal Medicine

## 2023-11-27 VITALS — BP 135/82 | Temp 100.0°F

## 2023-11-27 DIAGNOSIS — J069 Acute upper respiratory infection, unspecified: Secondary | ICD-10-CM

## 2023-11-27 DIAGNOSIS — B309 Viral conjunctivitis, unspecified: Secondary | ICD-10-CM

## 2023-11-27 DIAGNOSIS — R509 Fever, unspecified: Secondary | ICD-10-CM

## 2023-11-27 MED ORDER — BENZONATATE 100 MG PO CAPS: 100.0000 mg | ORAL_CAPSULE | Freq: Two times a day (BID) | ORAL | 0 refills | Status: DC | PRN

## 2023-11-27 NOTE — Telephone Encounter (Signed)
 Called patient back and she has sore throat, cough, sneezing, headache, fever 100.0, runny nose, fatigue. I have ask her to take a COVID test and get her vitals and send my chart message to let us know the results and I have set her up for video visit.

## 2023-11-27 NOTE — Telephone Encounter (Signed)
 Copied from CRM [id number]. Topic: Clinical - Medication Question >> Nov 27, 2023 10:20 AM Carlatta H wrote: Reason for CRM: Patient has returned from [place] and now is sick//She was prescribed Antibiotics from the Doctor before her trip//she would like to know if she needs to come in or start taking antibiotics//Please call

## 2023-11-27 NOTE — Progress Notes (Signed)
 Patient Care Team: Amanda Amanda PARAS, MD as PCP - General (Internal Medicine)  I connected with Amanda Potts on 11/27/23 at 12:30 PM by video enabled telemedicine visit and verified that I am speaking with the correct person using two identifiers, myself and Amanda Potts, CMA. I am in my office and patient is in her home.   I discussed the limitations, risks, security and privacy concerns of performing an evaluation and management service by telemedicine and the availability of in-person appointments. I also discussed with the patient that there may be a patient responsible charge related to this service. The patient expressed understanding and agreed to proceed.   Other persons participating in the visit and their role in the encounter: Medical scribe, Amanda Potts  Patient's location: Home  Provider's location: Clinic   I provided 10 minutes of face-to-face video visit time during this encounter, and > 50% was spent counseling as documented under my assessment & plan.   Chief Complaint: Sore throat   Subjective:    Patient ID: Amanda Potts , Female    DOB: 31-Jul-1946, 78 y.o.    MRN: [id number]   78 y.o. Female presents today for sick visit with sore throat, cough, sneezing, headache, fever of 100, rhinorrhea, and fatigue. Recently returned from [place] January 3rd. Endorses hoarse voice (onset Jan 1), sore throat (onset Jan 2), rhinorrhea and coughing (onset Jan 3), general malaise (Jan 5), fever of 100 (this morning), as well as watery eyes with some crust formation. She took an at-home Covid test with a NEG result. Is UTD on Covid and Flu vaccine. Denies discolored nasal drainage, expectoration of sputum, or N/V/D.   Past Medical History:  Diagnosis Date   Cataract    Eczema    Hyperlipidemia    Hypertension    Osteopenia      Family History  Problem Relation Age of Onset   Stroke Mother    COPD Father    Heart disease Father    Cancer Brother    Breast cancer Neg  Hx    Colon cancer Neg Hx    Colon polyps Neg Hx    Esophageal cancer Neg Hx    Rectal cancer Neg Hx    Stomach cancer Neg Hx    Inflammatory bowel disease Neg Hx    Liver disease Neg Hx    Pancreatic cancer Neg Hx     Social History   Social History Narrative   Social history: She is married.  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  Non-smoker.  Social alcohol consumption.  3 adult children.  Daughter resides in [place] and eldest son, Elgin, lives here in [place].  She has several grandchildren.  Enjoys traveling.  Husband is retired.       Family history: Brother living in good health in his late 13s.  Father died at age 32 with dementia and stroke.  Mother died at age 20 with COPD and heart failure.  1 brother died at age 55 with lung cancer and was a smoker.      Review of Systems  Constitutional:  Positive for fever (100, this morning) and malaise/fatigue.  HENT:  Positive for sore throat. Negative for congestion.        (+) Rhinorrhea  Eyes:  Positive for discharge. Negative for blurred vision.  Respiratory:  Positive for cough. Negative for shortness  of breath.   Cardiovascular:  Negative for chest pain, palpitations and leg swelling.  Gastrointestinal:  Negative for diarrhea, nausea and vomiting.  Musculoskeletal:  Negative for back pain.  Skin:  Negative for rash.  Neurological:  Negative for loss of consciousness and headaches.     Objective:   Vitals: BP 135/82   Temp 100 F (37.8 C)    Physical Exam Vitals and nursing note reviewed.  Constitutional:      General: She is not in acute distress.    Appearance: Normal appearance. She is not toxic-appearing.  HENT:     Head: Normocephalic and atraumatic.  Pulmonary:     Effort: Pulmonary effort is normal.  Skin:    General: Skin is warm and dry.  Neurological:     Mental Status: She is alert  and oriented to person, place, and time. Mental status is at baseline.  Psychiatric:        Mood and Affect: Mood normal.        Behavior: Behavior normal.        Thought Content: Thought content normal.        Judgment: Judgment normal.     Results:   Studies obtained and personally reviewed by me: Labs:      Component Value Date/Time   NA 139 12/01/2022 0909   K 5.1 12/01/2022 0909   CL 103 12/01/2022 0909   CO2 29 12/01/2022 0909   GLUCOSE 111 (H) 12/01/2022 0909   BUN 19 12/01/2022 0909   CREATININE 0.71 12/01/2022 0909   CALCIUM  9.3 12/01/2022 0909   PROT 6.7 12/01/2022 0909   ALBUMIN 4.2 10/07/2016 1034   AST 17 12/01/2022 0909   ALT 13 12/01/2022 0909   ALKPHOS 42 10/07/2016 1034   BILITOT 0.6 12/01/2022 0909   GFRNONAA 87 11/24/2020 1016   GFRAA 100 11/24/2020 1016    Lab Results  Component Value Date   WBC 6.6 12/01/2022   HGB 14.1 12/01/2022   HCT 41.8 12/01/2022   MCV 95.4 12/01/2022   PLT 257 12/01/2022   Lab Results  Component Value Date   CHOL 228 (H) 12/01/2022   HDL 105 12/01/2022   LDLCALC 105 (H) 12/01/2022   TRIG 89 12/01/2022   CHOLHDL 2.2 12/01/2022   Lab Results  Component Value Date   HGBA1C 5.6 12/01/2022    Lab Results  Component Value Date   TSH 2.03 12/01/2022   Assessment & Plan:   Acute upper respiratory infection with fever  Her at-home Covid test with a NEG result. Is UTD on Covid and Flu vaccine. Take 500 mg Levaquin  for 7 days with food. Sending 100 mg Tessalon  Perles - take up to 3 times daily daily as needed for cough. Stay well rested, well hydrated, and well nourished. Contact us  if symptoms persist/worsen despite treatment!  I,Amanda Potts,acting as a neurosurgeon for Amanda JINNY Hailstone, MD.,have documented all relevant documentation on the behalf of Amanda JINNY Hailstone, MD,as directed by  Amanda JINNY Hailstone, MD while in the presence of Amanda JINNY Hailstone, MD.   I, Amanda JINNY Hailstone, MD, have reviewed all documentation for this visit. The  documentation on 11/29/23 for the exam, diagnosis, procedures, and orders are all accurate and complete.

## 2023-11-28 ENCOUNTER — Encounter: Payer: Self-pay | Admitting: Internal Medicine

## 2023-11-28 ENCOUNTER — Telehealth: Payer: Medicare Other | Admitting: Internal Medicine

## 2023-11-28 VITALS — Ht 66.0 in | Wt 146.0 lb

## 2023-11-28 DIAGNOSIS — H10023 Other mucopurulent conjunctivitis, bilateral: Secondary | ICD-10-CM

## 2023-11-28 MED ORDER — OFLOXACIN 0.3 % OP SOLN: OPHTHALMIC | 1 refills | Status: AC

## 2023-11-28 NOTE — Progress Notes (Signed)
 Patient Care Team: Perri Ronal PARAS, MD as PCP - General (Internal Medicine)  I connected with Amanda Potts on 11/28/23 at 12:48 PM by video enabled telemedicine visit and verified that I am speaking with the correct person using two identifiers, myself and [name], CMA. I am in my office and patient is in their home.  I discussed the limitations, risks, security and privacy concerns of performing an evaluation and management service by telemedicine and the availability of in-person appointments. I also discussed with the patient that there may be a patient responsible charge related to this service. The patient expressed understanding and agreed to proceed.   Other persons participating in the visit and their role in the encounter: Medical scribe, Damien Blanks  Patient's location: Home  Provider's location: Clinic   I provided 10 minutes of non face time with patient today  Chief Complaint:  Chief Complaint  Patient presents with   EYES WATERING   EYE CRUSTY   EYE REDNESS   Subjective:  Patient ID: Amanda Potts , Female    DOB: 04/11/1946, 78 y.o.    MRN: [id number]   78 y.o. Female presents today for sick visit with red, watering eyes. Seen yesterday for cough and sore throat, she did endorse red watering eyes and was recommended to get some OTC eye-drops. Today she reports that her eyes have worsened and would like some prescription eye-drops. She can move her eyes without pain. Endorses headache.   Past Medical History:  Diagnosis Date   Cataract    Eczema    Hyperlipidemia    Hypertension    Osteopenia     Family History  Problem Relation Age of Onset   Stroke Mother    COPD Father    Heart disease Father    Cancer Brother    Breast cancer Neg Hx    Colon cancer Neg Hx    Colon polyps Neg Hx    Esophageal cancer Neg Hx    Rectal cancer Neg Hx    Stomach cancer Neg Hx    Inflammatory bowel disease Neg Hx    Liver disease Neg Hx    Pancreatic cancer Neg  Hx    Social History   Social History Narrative   Social history: She is married.  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  Non-smoker.  Social alcohol consumption.  3 adult children.  Daughter resides in [place] and eldest son, Elgin, lives here in [place].  She has several grandchildren.  Enjoys traveling.  Husband is retired.       Family history: Brother living in good health in his late 74s.  Father died at age 78 with dementia and stroke.  Mother died at age 55 with COPD and heart failure.  1 brother died at age 84 with lung cancer and was a smoker.   Review of Systems  Constitutional:  Negative for fever and malaise/fatigue.  HENT:  Negative for congestion.   Eyes:  Positive for discharge and redness. Negative for blurred vision.  Respiratory:  Negative for cough and shortness of breath.   Cardiovascular:  Negative for chest pain, palpitations and leg swelling.  Gastrointestinal:  Negative for vomiting.  Musculoskeletal:  Negative for back pain.  Skin:  Negative for rash.  Neurological:  Positive for headaches. Negative for loss of consciousness.     Objective:  Vitals: Ht  5' 6 (1.676 m)   Wt 146 lb (66.2 kg)   BMI 23.57 kg/m  Physical Exam Vitals and nursing note reviewed.  Constitutional:      General: She is not in acute distress.    Appearance: Normal appearance. She is not toxic-appearing.  HENT:     Head: Normocephalic and atraumatic.  Pulmonary:     Effort: Pulmonary effort is normal.  Skin:    General: Skin is warm and dry.  Neurological:     Mental Status: She is alert and oriented to person, place, and time. Mental status is at baseline.  Psychiatric:        Mood and Affect: Mood normal.        Behavior: Behavior normal.        Thought Content: Thought content normal.        Judgment: Judgment normal.     Results:  Studies obtained and  personally reviewed by me: Labs:      Component Value Date/Time   NA 139 12/01/2022 0909   K 5.1 12/01/2022 0909   CL 103 12/01/2022 0909   CO2 29 12/01/2022 0909   GLUCOSE 111 (H) 12/01/2022 0909   BUN 19 12/01/2022 0909   CREATININE 0.71 12/01/2022 0909   CALCIUM  9.3 12/01/2022 0909   PROT 6.7 12/01/2022 0909   ALBUMIN 4.2 10/07/2016 1034   AST 17 12/01/2022 0909   ALT 13 12/01/2022 0909   ALKPHOS 42 10/07/2016 1034   BILITOT 0.6 12/01/2022 0909   GFRNONAA 87 11/24/2020 1016   GFRAA 100 11/24/2020 1016    Lab Results  Component Value Date   WBC 6.6 12/01/2022   HGB 14.1 12/01/2022   HCT 41.8 12/01/2022   MCV 95.4 12/01/2022   PLT 257 12/01/2022   Lab Results  Component Value Date   CHOL 228 (H) 12/01/2022   HDL 105 12/01/2022   LDLCALC 105 (H) 12/01/2022   TRIG 89 12/01/2022   CHOLHDL 2.2 12/01/2022   Lab Results  Component Value Date   HGBA1C 5.6 12/01/2022    Lab Results  Component Value Date   TSH 2.03 12/01/2022   Assessment & Plan:  Conjunctivitis: Continue taking antibiotics sent in yesterday. You can use warm compresses twice a day for 20 minutes for relief. Wash your towels/wash-cloths daily and use new ones when needed. Sending in 5 cc Ocuflox  eye drops - use 2 drops in each eye 4 times per day for 5-7 days.   I,Emily Lagle,acting as a neurosurgeon for Ronal JINNY Hailstone, MD.,have documented all relevant documentation on the behalf of Ronal JINNY Hailstone, MD,as directed by  Ronal JINNY Hailstone, MD while in the presence of Ronal JINNY Hailstone, MD.   I, Ronal JINNY Hailstone, MD, have reviewed all documentation for this visit. The documentation on 11/28/23 for the exam, diagnosis, procedures, and orders are all accurate and complete.

## 2023-11-28 NOTE — Telephone Encounter (Signed)
 done

## 2023-11-28 NOTE — Patient Instructions (Addendum)
 Patient has been diagnosed with acute bacterial conjunctivitis.  Have prescribed Ocuflox ophthalmic solution 0.3% 2 drops in both eyes 4 times a day for 5 to 7 days.  May apply warm compresses to the area for 20 minutes 2 or 3 times daily as well.

## 2023-11-29 ENCOUNTER — Encounter: Payer: Self-pay | Admitting: Internal Medicine

## 2023-11-29 NOTE — Patient Instructions (Addendum)
 You have been diagnosed with an acute upper respiratory infection.  Home COVID test was negative.  Flu vaccine is up-to-date as is COVID-vaccine.  Patient will take Levaquin  500 milligrams daily for 7 days with a meal.  Also we are sending in Tessalon  Perles 100 mg to take 2-3 times daily as needed for cough.  Rest and stay well-hydrated.

## 2023-12-04 ENCOUNTER — Other Ambulatory Visit: Payer: Medicare Other

## 2023-12-04 DIAGNOSIS — E7800 Pure hypercholesterolemia, unspecified: Secondary | ICD-10-CM

## 2023-12-04 DIAGNOSIS — Z0000 Encounter for general adult medical examination without abnormal findings: Secondary | ICD-10-CM

## 2023-12-04 DIAGNOSIS — M8580 Other specified disorders of bone density and structure, unspecified site: Secondary | ICD-10-CM

## 2023-12-04 DIAGNOSIS — E039 Hypothyroidism, unspecified: Secondary | ICD-10-CM

## 2023-12-04 DIAGNOSIS — I10 Essential (primary) hypertension: Secondary | ICD-10-CM

## 2023-12-05 LAB — COMPLETE METABOLIC PANEL WITH GFR
AG Ratio: 1.8 (calc) (ref 1.0–2.5)
ALT: 20 U/L (ref 6–29)
AST: 22 U/L (ref 10–35)
Albumin: 4.1 g/dL (ref 3.6–5.1)
Alkaline phosphatase (APISO): 59 U/L (ref 37–153)
BUN: 20 mg/dL (ref 7–25)
CO2: 27 mmol/L (ref 20–32)
Calcium: 9.1 mg/dL (ref 8.6–10.4)
Chloride: 104 mmol/L (ref 98–110)
Creat: 0.71 mg/dL (ref 0.60–1.00)
Globulin: 2.3 g/dL (ref 1.9–3.7)
Glucose, Bld: 99 mg/dL (ref 65–99)
Potassium: 4.7 mmol/L (ref 3.5–5.3)
Sodium: 140 mmol/L (ref 135–146)
Total Bilirubin: 0.5 mg/dL (ref 0.2–1.2)
Total Protein: 6.4 g/dL (ref 6.1–8.1)
eGFR: 88 mL/min/{1.73_m2} (ref >=60)

## 2023-12-05 LAB — CBC WITH DIFFERENTIAL/PLATELET
Absolute Lymphocytes: 1813 {cells}/uL (ref 850–3900)
Absolute Monocytes: 762 {cells}/uL (ref 200–950)
Basophils Absolute: 30 {cells}/uL (ref 0–200)
Basophils Relative: 0.4 %
Eosinophils Absolute: 141 {cells}/uL (ref 15–500)
Eosinophils Relative: 1.9 %
HCT: 39.4 % (ref 35.0–45.0)
Hemoglobin: 12.7 g/dL (ref 11.7–15.5)
MCH: 30.6 pg (ref 27.0–33.0)
MCHC: 32.2 g/dL (ref 32.0–36.0)
MCV: 94.9 fL (ref 80.0–100.0)
MPV: 10.5 fL (ref 7.5–12.5)
Monocytes Relative: 10.3 %
Neutro Abs: 4655 {cells}/uL (ref 1500–7800)
Neutrophils Relative %: 62.9 %
Platelets: 321 10*3/uL (ref 140–400)
RBC: 4.15 10*6/uL (ref 3.80–5.10)
RDW: 12.2 % (ref 11.0–15.0)
Total Lymphocyte: 24.5 %
WBC: 7.4 10*3/uL (ref 3.8–10.8)

## 2023-12-05 LAB — LIPID PANEL
Cholesterol: 158 mg/dL (ref <200)
HDL: 67 mg/dL (ref >=50)
LDL Cholesterol (Calc): 75 mg/dL
Non-HDL Cholesterol (Calc): 91 mg/dL (ref <130)
Total CHOL/HDL Ratio: 2.4 (calc) (ref <5.0)
Triglycerides: 79 mg/dL (ref <150)

## 2023-12-05 LAB — TSH: TSH: 2.62 m[IU]/L (ref 0.40–4.50)

## 2023-12-07 ENCOUNTER — Ambulatory Visit (INDEPENDENT_AMBULATORY_CARE_PROVIDER_SITE_OTHER): Payer: Medicare Other | Admitting: Internal Medicine

## 2023-12-07 ENCOUNTER — Encounter: Payer: Self-pay | Admitting: Internal Medicine

## 2023-12-07 VITALS — BP 150/90 | HR 62 | Ht 66.0 in | Wt 147.0 lb

## 2023-12-07 DIAGNOSIS — I10 Essential (primary) hypertension: Secondary | ICD-10-CM | POA: Diagnosis not present

## 2023-12-07 DIAGNOSIS — H6691 Otitis media, unspecified, right ear: Secondary | ICD-10-CM | POA: Diagnosis not present

## 2023-12-07 DIAGNOSIS — E7800 Pure hypercholesterolemia, unspecified: Secondary | ICD-10-CM | POA: Diagnosis not present

## 2023-12-07 DIAGNOSIS — Z0000 Encounter for general adult medical examination without abnormal findings: Secondary | ICD-10-CM

## 2023-12-07 DIAGNOSIS — E039 Hypothyroidism, unspecified: Secondary | ICD-10-CM

## 2023-12-07 DIAGNOSIS — L309 Dermatitis, unspecified: Secondary | ICD-10-CM

## 2023-12-07 DIAGNOSIS — M8580 Other specified disorders of bone density and structure, unspecified site: Secondary | ICD-10-CM

## 2023-12-07 MED ORDER — METHYLPREDNISOLONE ACETATE 80 MG/ML IJ SUSP
80.0000 mg | Freq: Once | INTRAMUSCULAR | Status: AC
  Administered 2023-12-07: 80 mg via INTRAMUSCULAR

## 2023-12-07 MED ORDER — FLUCONAZOLE 150 MG PO TABS: 150.0000 mg | ORAL_TABLET | Freq: Once | ORAL | 0 refills | Status: AC

## 2023-12-07 MED ORDER — DOXYCYCLINE HYCLATE 100 MG PO TABS: 100.0000 mg | ORAL_TABLET | Freq: Two times a day (BID) | ORAL | 0 refills | Status: DC

## 2023-12-07 NOTE — Progress Notes (Signed)
Annual Wellness Visit   Patient Care Team: Margaree Mackintosh, MD as PCP - General (Internal Medicine)  Visit Date: 12/07/23   Chief Complaint  Patient presents with   Annual Exam   Subjective:  Patient: Amanda Potts, Female DOB: [birth year], 78 y.o. MRN: [id number]  Amanda Potts is a 78 y.o. Female who presents today for her Annual Wellness Visit. Patient has history of Essential Hypertension, Hypothyroidism and Osteopenia.   Was seen 11/27/23 & 11/28/23 via video visit for an illness. She reports that her eyes have improved but she is still having some congestion in her ears and had diarrhea following her antibiotics course.   History of Hypertension treated with 25 mg Metoprolol succinate daily. Blood pressure hypertensive at 150/90.   History of Hypothyroidism treated with 75 mcg Synthroid daily.   History of Eczema, usually re-occurs in Winter, treated with Betamethasone Dipropionate cream. Reports that she has one focal area of dry skin on her right leg that has not healed and seems to have developed a lesion. Tried Betamethasone Dipropionate intermittently without relief or resolve.   Labs 12/04/23 CBC: WNL CMP: WNL Lipid Panel: WNL TSH: 2.62  Colonoscopy 09/26/23 with External Hemorrhoids; Significant Looping of the Colon; Eight polyps (1 - 7 mm) removed from descending/transverse/ascending colon; Diverticulosis in recto-sigmoid/sigmoid colon; Otherwise normal with repeat recommendation of 2027  PAP Smear 07/31/14 was normal with discontinued repeat.   Mammogram 03/21/23 was normal with repeat recommendation of 2025  Bone DEXA 04/28/22 with lowest T-score -0.8, considered normal, with repeat recommendation of 2025. History of Osteopenia treated with 1.25 mg supplemental Vitamin-D.    Vaccine Counseling: Due for Tdap, Flu, Covid-19, and 1/2 of Shingles; UTD on PNA Past Medical History:  Diagnosis Date   Cataract    Eczema    Hyperlipidemia    Hypertension    Osteopenia    Medical/Surgical History Narrative:  Allergic to Sulfa-Abx  Bilateral Cataract Extractions  History of Osteopenia, previously she has tried Actonel - caused myalgias. Tried Fosamax - caused heartburn. On Vitamin-D supplementation.   Family History  Problem Relation Age of Onset   Stroke Mother    COPD Father    Heart disease Father    Cancer Brother    Breast cancer Neg Hx    Colon cancer Neg Hx    Colon polyps Neg Hx    Esophageal cancer Neg Hx    Rectal cancer Neg Hx    Stomach cancer Neg Hx    Inflammatory bowel disease Neg Hx    Liver disease Neg Hx    Pancreatic cancer Neg Hx   Family History Narrative: Father died at age 34 with Dementia and Stroke.  Mother died at age 60 with COPD and Heart Failure.  2 Brothers: 1 Brother living in good health in his late 58s. Other brother died at age 27 with Lung Cancer - was a smoker.   Social History   Social History Narrative   Married, husband is retired. 3 adult children: daughter resides in [place] and eldest son, Rayna Sexton, lives here in [place]. She has several grandchildren. 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 Non-smoker. Social alcohol consumption.  Enjoys traveling.           Review of Systems  Constitutional:  Negative for chills, fever, malaise/fatigue and weight loss.  HENT:  Negative for hearing loss, sinus pain and sore throat.  Eyes:  Positive for redness. Negative for pain and discharge.  Respiratory:  Negative for cough, hemoptysis and shortness of breath.   Cardiovascular:  Negative for chest pain, palpitations, leg swelling and PND.  Gastrointestinal:  Negative for abdominal pain, constipation, diarrhea, heartburn, nausea and vomiting.  Genitourinary:  Negative for dysuria, frequency and urgency.  Musculoskeletal:  Negative for back pain, myalgias and neck pain.  Skin:  Negative for itching and rash.        (+) Dry Skin - eczema flare, focal area, not relieved/resolved with Betamethasone Dipropionate   Neurological:  Negative for dizziness, tingling, seizures and headaches.  Endo/Heme/Allergies:  Negative for polydipsia.  Psychiatric/Behavioral:  Negative for depression. The patient is not nervous/anxious.     Objective:  Vitals: BP (!) 150/90   Pulse 62   Ht 5\' 6"  (1.676 m)   Wt 147 lb (66.7 kg)   SpO2 97%   BMI 23.73 kg/m  Physical Exam Vitals and nursing note reviewed.  Constitutional:      General: She is not in acute distress.    Appearance: Normal appearance. She is not ill-appearing or toxic-appearing.  HENT:     Head: Normocephalic and atraumatic.     Left Ear: Hearing, ear canal and external ear normal. Tympanic membrane is erythematous (pinkish).     Ears:     Comments: Right Ear: TM dull & pinkish Splayed light reflex      Mouth/Throat:     Pharynx: Oropharynx is clear.  Eyes:     Extraocular Movements: Extraocular movements intact.     Pupils: Pupils are equal, round, and reactive to light.     Comments: Slightly red  Neck:     Thyroid: No thyroid mass, thyromegaly or thyroid tenderness.     Vascular: No carotid bruit.  Cardiovascular:     Rate and Rhythm: Normal rate and regular rhythm. No extrasystoles are present.    Pulses:          Dorsalis pedis pulses are 1+ on the right side and 1+ on the left side.     Heart sounds: Normal heart sounds. No murmur heard.    No friction rub. No gallop.  Pulmonary:     Effort: Pulmonary effort is normal.     Breath sounds: Normal breath sounds. No decreased breath sounds, wheezing, rhonchi or rales.  Chest:     Chest wall: No mass.  Abdominal:     Palpations: Abdomen is soft. There is no hepatomegaly, splenomegaly or mass.     Tenderness: There is no abdominal tenderness.     Hernia: No hernia is present.  Musculoskeletal:     Cervical back: Normal range of motion.     Right lower leg: No edema.     Left lower  leg: No edema.  Lymphadenopathy:     Cervical: No cervical adenopathy.     Upper Body:     Right upper body: No supraclavicular adenopathy.     Left upper body: No supraclavicular adenopathy.  Skin:    General: Skin is warm and dry.     Findings: Lesion (right lower extremity, attributed to eczema flare) present.  Neurological:     General: No focal deficit present.     Mental Status: She is alert and oriented to person, place, and time. Mental status is at baseline.     Sensory: Sensation is intact.     Motor: Motor function is intact. No weakness.     Deep Tendon Reflexes: Reflexes are  normal and symmetric.  Psychiatric:        Attention and Perception: Attention normal.        Mood and Affect: Mood normal.        Speech: Speech normal.        Behavior: Behavior normal.        Thought Content: Thought content normal.        Cognition and Memory: Cognition normal.        Judgment: Judgment normal.    Most recent functional status assessment:    12/07/2023    3:01 PM  In your present state of health, do you have any difficulty performing the following activities:  Hearing? 0  Vision? 1  Difficulty concentrating or making decisions? 0  Walking or climbing stairs? 0  Dressing or bathing? 0  Doing errands, shopping? 0  Preparing Food and eating ? N  Using the Toilet? N  In the past six months, have you accidently leaked urine? N  Do you have problems with loss of bowel control? N  Managing your Medications? N  Managing your Finances? N  Housekeeping or managing your Housekeeping? N   Most recent fall risk assessment:    12/07/2023    2:57 PM  Fall Risk   Falls in the past year? 0  Number falls in past yr: 0  Injury with Fall? 0  Risk for fall due to : No Fall Risks  Follow up Falls prevention discussed;Education provided;Falls evaluation completed    Most recent depression screenings:    12/07/2023    2:58 PM 12/05/2022   11:05 AM  PHQ 2/9 Scores  PHQ - 2 Score 0  0   Most recent cognitive screening:    12/07/2023    3:02 PM  6CIT Screen  What Year? 0 points  What month? 0 points  What time? 0 points  Count back from 20 0 points  Months in reverse 0 points  Repeat phrase 0 points  Total Score 0 points   Results:  Studies obtained and personally reviewed by me:  Colonoscopy 09/26/23 with External Hemorrhoids; Significant Looping of the Colon; Eight polyps (1 - 7 mm) removed from descending/transverse/ascending colon; Diverticulosis in recto-sigmoid/sigmoid colon; Otherwise normal   PAP Smear 07/31/14 was normal   Mammogram 03/21/23 was normal   Bone DEXA 04/28/22 with lowest T-score -0.8, considered normal  Labs:     Component Value Date/Time   NA 140 12/04/2023 1132   K 4.7 12/04/2023 1132   CL 104 12/04/2023 1132   CO2 27 12/04/2023 1132   GLUCOSE 99 12/04/2023 1132   BUN 20 12/04/2023 1132   CREATININE 0.71 12/04/2023 1132   CALCIUM 9.1 12/04/2023 1132   PROT 6.4 12/04/2023 1132   ALBUMIN 4.2 10/07/2016 1034   AST 22 12/04/2023 1132   ALT 20 12/04/2023 1132   ALKPHOS 42 10/07/2016 1034   BILITOT 0.5 12/04/2023 1132   GFRNONAA 87 11/24/2020 1016   GFRAA 100 11/24/2020 1016     Lab Results  Component Value Date   WBC 7.4 12/04/2023   HGB 12.7 12/04/2023   HCT 39.4 12/04/2023   MCV 94.9 12/04/2023   PLT 321 12/04/2023    Lab Results  Component Value Date   CHOL 158 12/04/2023   HDL 67 12/04/2023   LDLCALC 75 12/04/2023   TRIG 79 12/04/2023   CHOLHDL 2.4 12/04/2023    Lab Results  Component Value Date   HGBA1C 5.6 12/01/2022     Lab Results  Component Value Date   TSH 2.62 12/04/2023   Assessment & Plan:  Persistent Right-Sided Otitis Media: Did not respond to 500 mg Levaquin, 7 day course. Sending in 100 mg Doxycycline - take 1 tablet (100 mg total) by mouth 2 (two) times daily and 150 mg Diflucan in case you develop Candida vaginitis. Received 80 mg Depo-Medrol. Recommend OTC decongestant such as Dayquil for  further relief.   Diarrhea: Was seen 11/27/23 & 11/28/23 via video visit for an illness, for which she took Levaquin. Reports subsequent diarrhea following her antibiotics course. Recommended OTC probiotics to restore GI microflora.  Eczema with seasonal recurrences, treated with Betamethasone Dipropionate cream. Reports one focal area of dry skin on right leg that has not healed and seems to have developed a lesion. Tried Betamethasone Dipropionate intermittently without relief or resolve - try daily application of Betamethasone Dipropionate. If symptoms don't resolve contact your dermatologist.     Hypertension treated with 25 mg Metoprolol succinate daily. Blood pressure hypertensive at 150/90, understandable considering her current illness.    Hypothyroidism treated with 75 mcg Synthroid daily.   Colonoscopy 09/26/23 with External Hemorrhoids; Significant Looping of the Colon; Eight polyps (1 - 7 mm) removed from descending/transverse/ascending colon; Diverticulosis in recto-sigmoid/sigmoid colon; Otherwise normal with repeat recommendation of 2027  PAP Smear 07/31/14 was normal with discontinued repeat due to age.   Mammogram 03/21/23 was normal with repeat recommendation of 2025  Bone DEXA 04/28/22 with lowest T-score -0.8, considered normal, with repeat recommendation of 2025. Takes 1.25 mg supplemental Vitamin-D.    Vaccine Counseling: Due for Tdap, Flu, Covid-19, and 1/2 of Shingles; UTD on PNA    Annual wellness visit done today including the all of the following: Reviewed patient's Family Medical History Reviewed and updated list of patient's medical providers Assessment of cognitive impairment was done Assessed patient's functional ability Established a written schedule for health screening services Health Risk Assessent Completed and Reviewed  Discussed health benefits of physical activity, and encouraged her to engage in regular exercise appropriate for her age and condition.         I,Emily Lagle,acting as a Neurosurgeon for Margaree Mackintosh, MD.,have documented all relevant documentation on the behalf of Margaree Mackintosh, MD,as directed by  Margaree Mackintosh, MD while in the presence of Margaree Mackintosh, MD.   I, Margaree Mackintosh, MD, have reviewed all documentation for this visit. The documentation on 12/22/23 for the exam, diagnosis, procedures, and orders are all accurate and complete.

## 2023-12-18 ENCOUNTER — Encounter: Payer: Self-pay | Admitting: Internal Medicine

## 2023-12-18 NOTE — Telephone Encounter (Signed)
Appointment scheduled.

## 2023-12-21 ENCOUNTER — Ambulatory Visit: Payer: Medicare Other | Admitting: Internal Medicine

## 2023-12-21 ENCOUNTER — Encounter: Payer: Self-pay | Admitting: Internal Medicine

## 2023-12-21 VITALS — BP 130/80 | HR 63 | Ht 66.0 in | Wt 147.0 lb

## 2023-12-21 DIAGNOSIS — H6501 Acute serous otitis media, right ear: Secondary | ICD-10-CM | POA: Diagnosis not present

## 2023-12-21 MED ORDER — CLINDAMYCIN HCL 300 MG PO CAPS: 300.0000 mg | ORAL_CAPSULE | Freq: Three times a day (TID) | ORAL | 0 refills | Status: DC

## 2023-12-21 NOTE — Progress Notes (Signed)
Patient Care Team: Margaree Mackintosh, MD as PCP - General (Internal Medicine)  Visit Date: 12/21/23  Subjective:   Chief Complaint  Patient presents with   Otitis Media    Right ear popping, not better.    Patient ZO:XWRUEA B Muilenburg,Female DOB:Apr 11, 1946,77 y.o. VWU:[id number]   78 y.o. Female presents today for 2 week follow-up for Right Otitis Media treated with 80 mg Depo-Medrol IM that day and 100 mg Doxycycline BID x10 days. Was also seen 11/28/23 for acute febrile illness treated with Levaquin x7 days and then 1/8 for conjunctivitis treated with Ofloxacin. Reports that all her other symptoms resolved, except for an occasional mild cough, but her right ear still feels congested. Has tried holding nose and blowing and laying head on warm surface. Endorses frequent popping, sensation of being in a wind tunnel occasionally, and a gurgling sound when bending over. Notes her ear began to to bother her since January 2nd. She did buy some decongestants but avoided taking them as the pharmacist said they may interact with her synthroid or blood pressure medications.   Allergies  Allergen Reactions   Sulfa Antibiotics Hives    Past Medical History:  Diagnosis Date   Cataract    Eczema    Hyperlipidemia    Hypertension    Osteopenia     Family History  Problem Relation Age of Onset   Stroke Mother    COPD Father    Heart disease Father    Cancer Brother    Breast cancer Neg Hx    Colon cancer Neg Hx    Colon polyps Neg Hx    Esophageal cancer Neg Hx    Rectal cancer Neg Hx    Stomach cancer Neg Hx    Inflammatory bowel disease Neg Hx    Liver disease Neg Hx    Pancreatic cancer Neg Hx    Social History   Social History Narrative   Social history: She is married.  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  Non-smoker.  Social alcohol consumption.  3 adult children.  Daughter  resides in [place] and eldest son, Rayna Sexton, lives here in [place].  She has several grandchildren.  Enjoys traveling.  Husband is retired.       Family history: Brother living in good health in his late 22s.  Father died at age 10 with dementia and stroke.  Mother died at age 80 with COPD and heart failure.  1 brother died at age 71 with lung cancer and was a smoker.   Review of Systems  Constitutional:  Negative for fever and malaise/fatigue.  HENT:  Positive for ear pain (right). Negative for congestion.        (+) frequent ear popping, sensation of being in a wind tunnel occasionally, and a gurgling sound when bending over  Eyes:  Negative for blurred vision.  Respiratory:  Positive for cough (slight, occasional). Negative for shortness of breath.   Cardiovascular:  Negative for chest pain, palpitations and leg swelling.  Gastrointestinal:  Negative for vomiting.  Musculoskeletal:  Negative for back pain.  Skin:  Negative for rash.  Neurological:  Negative for loss of consciousness and headaches.     Objective:  Vitals: BP 130/80   Pulse 63   Ht 5\' 6"  (1.676 m)   Wt 147 lb (66.7 kg)   SpO2 98%   BMI 23.73 kg/m  Physical Exam Vitals and nursing note reviewed.  Constitutional:      General: She is not in acute distress.    Appearance: Normal appearance. She is not toxic-appearing.  HENT:     Head: Normocephalic and atraumatic.     Right Ear: Tympanic membrane is not perforated or erythematous.     Ears:     Comments: Right ear dull, not red, not full, no perforation, not as pink as would be preferred Pulmonary:     Effort: Pulmonary effort is normal.  Skin:    General: Skin is warm and dry.  Neurological:     Mental Status: She is alert and oriented to person, place, and time. Mental status is at baseline.  Psychiatric:        Mood and Affect: Mood normal.        Behavior: Behavior normal.        Thought Content: Thought content normal.        Judgment: Judgment  normal.     Results:  Studies Obtained And Personally Reviewed By Me: Labs:     Component Value Date/Time   NA 140 12/04/2023 1132   K 4.7 12/04/2023 1132   CL 104 12/04/2023 1132   CO2 27 12/04/2023 1132   GLUCOSE 99 12/04/2023 1132   BUN 20 12/04/2023 1132   CREATININE 0.71 12/04/2023 1132   CALCIUM 9.1 12/04/2023 1132   PROT 6.4 12/04/2023 1132   ALBUMIN 4.2 10/07/2016 1034   AST 22 12/04/2023 1132   ALT 20 12/04/2023 1132   ALKPHOS 42 10/07/2016 1034   BILITOT 0.5 12/04/2023 1132   GFRNONAA 87 11/24/2020 1016   GFRAA 100 11/24/2020 1016    Lab Results  Component Value Date   WBC 7.4 12/04/2023   HGB 12.7 12/04/2023   HCT 39.4 12/04/2023   MCV 94.9 12/04/2023   PLT 321 12/04/2023   Lab Results  Component Value Date   CHOL 158 12/04/2023   HDL 67 12/04/2023   LDLCALC 75 12/04/2023   TRIG 79 12/04/2023   CHOLHDL 2.4 12/04/2023   Lab Results  Component Value Date   HGBA1C 5.6 12/01/2022    Lab Results  Component Value Date   TSH 2.62 12/04/2023    Assessment & Plan:   Right Serous Otitis Media: has not improved. Has been previously treated with Levaquin and Doxycycline. Sending in 300 mg Clindamycin - take 1 capsule (300 mg total) by mouth 3 (three) times daily for 7  days. Recommended taking Dayquil decongestant as well for some symptom relief.  May need to see ENT if not improving.    I,Emily Lagle,acting as a Neurosurgeon for Margaree Mackintosh, MD.,have documented all relevant documentation on the behalf of Margaree Mackintosh, MD,as directed by  Margaree Mackintosh, MD while in the presence of Margaree Mackintosh, MD.   I, Margaree Mackintosh, MD, have reviewed all documentation for this visit. The documentation on 12/21/23 for the exam, diagnosis, procedures, and orders are all accurate and complete.

## 2023-12-21 NOTE — Patient Instructions (Signed)
Take Cleocin 300 mg 3 times daily for 7 days.  May take DayQuil as a decongestant.  If symptoms not improving may want to consider ENT referral.

## 2023-12-22 ENCOUNTER — Encounter: Payer: Self-pay | Admitting: Internal Medicine

## 2023-12-22 NOTE — Patient Instructions (Addendum)
Vaccines discussed.  Suggest bone density study 2025 as well as mammogram.  Colonoscopy not due until 2027.  Continue thyroid replacement medication.  Try probiotics for antibiotic associated diarrhea.  If not resolving we can do further studies.  Right sided otitis media has been persistent and stubborn.  Please try doxycycline 100 mg twice daily for 10 days and Diflucan if needed for Candida vaginitis.  Given Depo-Medrol 80 mg IM.  May try over-the-counter decongestant such as DayQuil.

## 2023-12-25 ENCOUNTER — Other Ambulatory Visit: Payer: Self-pay | Admitting: Internal Medicine

## 2023-12-29 ENCOUNTER — Encounter: Payer: Self-pay | Admitting: Internal Medicine

## 2024-01-12 ENCOUNTER — Other Ambulatory Visit: Payer: Self-pay | Admitting: Internal Medicine

## 2024-01-26 ENCOUNTER — Other Ambulatory Visit: Payer: Self-pay | Admitting: Internal Medicine

## 2024-02-21 ENCOUNTER — Telehealth (INDEPENDENT_AMBULATORY_CARE_PROVIDER_SITE_OTHER): Payer: Self-pay | Admitting: Otolaryngology

## 2024-02-21 NOTE — Telephone Encounter (Signed)
 Confirmed appt & location [id number]

## 2024-02-22 ENCOUNTER — Ambulatory Visit (INDEPENDENT_AMBULATORY_CARE_PROVIDER_SITE_OTHER): Payer: Medicare Other | Admitting: Otolaryngology

## 2024-02-22 ENCOUNTER — Encounter (INDEPENDENT_AMBULATORY_CARE_PROVIDER_SITE_OTHER): Payer: Self-pay

## 2024-02-22 VITALS — BP 157/83 | HR 70 | Ht 66.0 in | Wt 145.0 lb

## 2024-02-22 DIAGNOSIS — H6991 Unspecified Eustachian tube disorder, right ear: Secondary | ICD-10-CM | POA: Diagnosis not present

## 2024-02-22 DIAGNOSIS — H938X1 Other specified disorders of right ear: Secondary | ICD-10-CM

## 2024-02-22 DIAGNOSIS — H6521 Chronic serous otitis media, right ear: Secondary | ICD-10-CM | POA: Diagnosis not present

## 2024-02-22 DIAGNOSIS — H9190 Unspecified hearing loss, unspecified ear: Secondary | ICD-10-CM | POA: Diagnosis not present

## 2024-02-22 MED ORDER — FLUTICASONE PROPIONATE 50 MCG/ACT NA SUSP: 2.0000 | Freq: Two times a day (BID) | NASAL | 6 refills | Status: DC

## 2024-02-22 MED ORDER — PREDNISONE 20 MG PO TABS: 20.0000 mg | ORAL_TABLET | Freq: Every day | ORAL | 0 refills | Status: AC

## 2024-02-22 NOTE — Progress Notes (Signed)
 Dear Dr. Lenord Fellers, Here is my assessment for our mutual patient, Amanda Potts. Thank you for allowing me the opportunity to care for your patient. Please do not hesitate to contact me should you have any other questions. Sincerely, Dr. Jovita Kussmaul  Otolaryngology Clinic Note Referring provider: Dr. Lenord Fellers HPI:  Amanda Potts is a 78 y.o. female kindly referred by Dr. Lenord Fellers for evaluation of right ear fullness  Patient reports: started to have URI symptoms after trip to [place] with hoarse voice, sore throat, rhinorrhea, coughing, malaise in early Jan 2025. Prescribed Levaquin, Doxycycline, Clindamycin, and IM steroid shot without any benefit. Continues to report right ear crackling/gurgling, popping and hearing is muffled in that ear. Left side without issue. She does have ETD symptoms especially when flying and takes decongestants to clear them Patient denies: ear pain, vertigo, drainage, tinnitus Patient also denies barotrauma, vestibular suppressant use, ototoxic medication use Prior ear surgery: no  Does not get frequent sinus infections, but does have allergies (does have sneezing/rhinorrhea, typical AR symptoms). She has not tried nasal sprays but is on PO anthistamine  H&N Surgery: no Personal or FHx of bleeding dz or anesthesia difficulty: no   AP/AC: no  Tobacco: no  PMHx: HTN, Hypothyroid, Eczema  Independent Review of Additional Tests or Records:  Dr. Lenord Fellers FM (multiple notes reviewed including 11/27/2023, 12/07/2023, 12/21/2023): started to have URI symptoms after trip to [place] with hoarse voice, sore throat, rhinorrhea, coughing, malaise in early Jan 2025. Prescribed Levaquin and tessalon pearls (11/27/2023) for URI; persistent ear congestion in 12/07/2023, noted persistent OM and given doxy and steroid shot on 12/07/2023; on 12/21/2023, still with right ear fullness with frequent popping; she was then given Clinda and dayquil and ref to ENT CBC and CMP 12/04/2023: WBC 7.4, Hgb  12.7, Plt 321; BUN/Cr 20/0.71  PMH/Meds/All/SocHx/FamHx/ROS:   Past Medical History:  Diagnosis Date   Cataract    Eczema    Hyperlipidemia    Hypertension    Osteopenia      Past Surgical History:  Procedure Laterality Date   EYE SURGERY  2010   cataract OS    Family History  Problem Relation Age of Onset   Stroke Mother    COPD Father    Heart disease Father    Cancer Brother    Breast cancer Neg Hx    Colon cancer Neg Hx    Colon polyps Neg Hx    Esophageal cancer Neg Hx    Rectal cancer Neg Hx    Stomach cancer Neg Hx    Inflammatory bowel disease Neg Hx    Liver disease Neg Hx    Pancreatic cancer Neg Hx      Social Connections: Moderately Integrated (12/07/2023)   Social Connection and Isolation Panel [NHANES]    Frequency of Communication with Friends and Family: More than three times a week    Frequency of Social Gatherings with Friends and Family: Three times a week    Attends Religious Services: Never    Active Member of Clubs or Organizations: Yes    Attends Engineer, structural: More than 4 times per year    Marital Status: Married      Current Outpatient Medications:    B Complex Vitamins (B COMPLEX PO), Take by mouth., Disp: , Rfl:    betamethasone dipropionate 0.05 % cream, APPLY TOPICALLY TWICE A DAY, Disp: 30 g, Rfl: 0   clindamycin (CLEOCIN) 300 MG capsule, Take 1 capsule (300 mg total) by mouth  3 (three) times daily., Disp: 21 capsule, Rfl: 0   doxycycline (VIBRA-TABS) 100 MG tablet, Take 1 tablet (100 mg total) by mouth 2 (two) times daily., Disp: 20 tablet, Rfl: 0   fluticasone (FLONASE) 50 MCG/ACT nasal spray, Place 2 sprays into both nostrils 2 (two) times daily., Disp: 16 g, Rfl: 6   loratadine (CLARITIN) 10 MG tablet, Take 10 mg by mouth daily., Disp: , Rfl:    metoprolol succinate (TOPROL-XL) 25 MG 24 hr tablet, TAKE 1 TABLET BY MOUTH EVERY DAY, Disp: 90 tablet, Rfl: 1   predniSONE (DELTASONE) 20 MG tablet, Take 1 tablet (20 mg  total) by mouth daily with breakfast for 5 days., Disp: 5 tablet, Rfl: 0   rosuvastatin (CRESTOR) 5 MG tablet, TAKE ONE TAB BY MOUTH TWICE A WEEK FOR MILD HYPERLIPIDEMIA, Disp: 26 tablet, Rfl: 3   SYNTHROID 75 MCG tablet, TAKE 1 TABLET BY MOUTH EVERY DAY, Disp: 90 tablet, Rfl: 1   Vitamin D, Ergocalciferol, (DRISDOL) 1.25 MG (50000 UNIT) CAPS capsule, TAKE 1 CAPSULE BY MOUTH ONE TIME PER WEEK, Disp: 12 capsule, Rfl: 3   benzonatate (TESSALON) 100 MG capsule, Take 1 capsule (100 mg total) by mouth 2 (two) times daily as needed for cough. (Patient not taking: Reported on 02/22/2024), Disp: 14 capsule, Rfl: 0   ofloxacin (OCUFLOX) 0.3 % ophthalmic solution, 2 drops in each eye 4 times a day for 5 to 7 days (Patient not taking: Reported on 02/22/2024), Disp: 5 mL, Rfl: 1   Physical Exam:   BP (!) 157/83 (BP Location: Left Arm, Patient Position: Sitting, Cuff Size: Normal)   Pulse 70   Ht 5\' 6"  (1.676 m)   Wt 145 lb (65.8 kg)   SpO2 98%   BMI 23.40 kg/m   Salient findings:  CN II-XII intact Given history and complaints, ear microscopy was indicated and performed for evaluation with findings as below in physical exam section and in procedures; Bilateral EAC clear and TM intact with well pneumatized middle ear space on left; right serous effusion  Weber 512: right Rinne 512: AC > BC b/l Anterior rhinoscopy: Septum relatively midline; bilateral inferior turbinates without significant hypertrophy; Nasal endoscopy was indicated to better evaluate the nose and paranasal sinuses, given the patient's history and exam findings, and is detailed below. No lesions of oral cavity/oropharynx No obviously palpable neck masses/lymphadenopathy/thyromegaly No respiratory distress or stridor  Seprately Identifiable Procedures:  PROCEDURE: Bilateral Diagnostic Rigid Nasal Endoscopy Pre-procedure diagnosis: Concern for nasal mass, right serous otitis media Post-procedure diagnosis: same Indication: See pre-procedure  diagnosis and physical exam above Complications: None apparent EBL: 0 mL Anesthesia: Lidocaine 4% and topical decongestant was topically sprayed in each nasal cavity  Description of Procedure:  Patient was identified. A rigid 30 degree endoscope was utilized to evaluate the sinonasal cavities, mucosa, sinus ostia and turbinates and septum.  Overall, signs of mucosal inflammation are not noted.  Also noted are no masses over either eustachian tube.  No mucopurulence, polyps, or masses noted.   Right Middle meatus: clear Right SE Recess: clear Left MM: clear Left SE Recess: clear    Photodocumentation was obtained.  CPT CODE -- 31231 - Mod 25  Procedure: Bilateral ear microscopy using microscope (CPT [id number]) Pre-procedure diagnosis: right otitis media Post-procedure diagnosis: same Indication: Concern for right otitis media; given patient's otologic complaints and history, for improved and comprehensive examination of external ear and tympanic membrane, bilateral otologic examination using microscope was performed  Procedure: Patient was placed semi-recumbent. Both ear  canals were examined using the microscope with findings above.  Patient tolerated the procedure well.   Impression & Plans:  Almendra Loria is a 78 y.o. female with:  1. Right chronic serous otitis media   2. Subjective hearing loss   3. Sensation of fullness in right ear   4. Dysfunction of right eustachian tube    Sx started after URI and trip in early January. No improvement despite abx. No eustachian tube masses; we discussed options including: 1. Observation 2. Medical management 3. Tymapanostomy tube We discussed R/B/A of tube including perforation Patient opted for medical management - will do a pred burst and flonase BID F/u in 4 weeks   See below regarding exact medications prescribed this encounter including dosages and route: Meds ordered this encounter  Medications   predniSONE (DELTASONE) 20 MG tablet     Sig: Take 1 tablet (20 mg total) by mouth daily with breakfast for 5 days.    Dispense:  5 tablet    Refill:  0   fluticasone (FLONASE) 50 MCG/ACT nasal spray    Sig: Place 2 sprays into both nostrils 2 (two) times daily.    Dispense:  16 g    Refill:  6      Thank you for allowing me the opportunity to care for your patient. Please do not hesitate to contact me should you have any other questions.  Sincerely, Jovita Kussmaul, MD Otolaryngologist (ENT), [facility] Phone: [phone] Fax: [phone]  02/22/2024, 10:36 AM   MDM:  Level 4 - [phone] Complexity/Problems addressed: mod Data complexity: mod - independent review of notes, labs, ordering tests - Morbidity: mod  - Prescription Drug prescribed or managed: yes

## 2024-02-22 NOTE — Patient Instructions (Signed)
 Use 20mg  of Prednisone daily for 5 days Use flonase nasal spray two sprays each nostril twice daily for 6 weeks

## 2024-03-28 ENCOUNTER — Ambulatory Visit (INDEPENDENT_AMBULATORY_CARE_PROVIDER_SITE_OTHER): Admitting: Audiology

## 2024-03-28 ENCOUNTER — Ambulatory Visit (INDEPENDENT_AMBULATORY_CARE_PROVIDER_SITE_OTHER): Admitting: Otolaryngology

## 2024-03-28 ENCOUNTER — Encounter (INDEPENDENT_AMBULATORY_CARE_PROVIDER_SITE_OTHER): Payer: Self-pay | Admitting: Otolaryngology

## 2024-03-28 VITALS — BP 150/85 | HR 65 | Ht 66.0 in | Wt 145.0 lb

## 2024-03-28 DIAGNOSIS — H6521 Chronic serous otitis media, right ear: Secondary | ICD-10-CM

## 2024-03-28 DIAGNOSIS — H6991 Unspecified Eustachian tube disorder, right ear: Secondary | ICD-10-CM | POA: Diagnosis not present

## 2024-03-28 DIAGNOSIS — H9071 Mixed conductive and sensorineural hearing loss, unilateral, right ear, with unrestricted hearing on the contralateral side: Secondary | ICD-10-CM

## 2024-03-28 DIAGNOSIS — H9042 Sensorineural hearing loss, unilateral, left ear, with unrestricted hearing on the contralateral side: Secondary | ICD-10-CM

## 2024-03-28 DIAGNOSIS — H9011 Conductive hearing loss, unilateral, right ear, with unrestricted hearing on the contralateral side: Secondary | ICD-10-CM

## 2024-03-28 DIAGNOSIS — H938X1 Other specified disorders of right ear: Secondary | ICD-10-CM

## 2024-03-28 DIAGNOSIS — H90A31 Mixed conductive and sensorineural hearing loss, unilateral, right ear with restricted hearing on the contralateral side: Secondary | ICD-10-CM

## 2024-03-28 MED ORDER — OFLOXACIN 0.3 % OT SOLN: 4.0000 [drp] | Freq: Two times a day (BID) | OTIC | 1 refills | Status: AC

## 2024-03-28 NOTE — Patient Instructions (Signed)
 Use ofloxacin  ear drops 4 drops twice daily for 7 days;

## 2024-03-28 NOTE — Progress Notes (Signed)
 Dear Dr. Liane Redman, Here is my assessment for our mutual patient, Amanda Potts. Thank you for allowing me the opportunity to care for your patient. Please do not hesitate to contact me should you have any other questions. Sincerely, Dr. Milon Aloe  Otolaryngology Clinic Note Referring provider: Dr. Liane Redman HPI:  Amanda Potts is a 78 y.o. female kindly referred by Dr. Liane Redman for evaluation of right ear fullness  Initial visit (02/2024): Patient reports: started to have URI symptoms after trip to [place] with hoarse voice, sore throat, rhinorrhea, coughing, malaise in early Jan 2025. Prescribed Levaquin , Doxycycline , Clindamycin , and IM steroid shot without any benefit. Continues to report right ear crackling/gurgling, popping and hearing is muffled in that ear. Left side without issue. She does have ETD symptoms especially when flying and takes decongestants to clear them Patient denies: ear pain, vertigo, drainage, tinnitus Patient also denies barotrauma, vestibular suppressant use, ototoxic medication use Prior ear surgery: no  Does not get frequent sinus infections, but does have allergies (does have sneezing/rhinorrhea, typical AR symptoms). She has not tried nasal sprays but is on PO anthistamine  --------------------------------------------------------- 03/28/2024 She returns for follow up. Is on flonase  and finished prednisone . Still no improvement. No ear pain, vertigo, drainage but still has right sided ear pressure. No headaches. She wishes to proceed with ear tube placement   H&N Surgery: no Personal or FHx of bleeding dz or anesthesia difficulty: no   AP/AC: no  Tobacco: no  PMHx: HTN, Hypothyroid, Eczema  Independent Review of Additional Tests or Records:  Dr. Leonard Raker (multiple notes reviewed including 11/27/2023, 12/07/2023, 12/21/2023): started to have URI symptoms after trip to [place] with hoarse voice, sore throat, rhinorrhea, coughing, malaise in early Jan 2025.  Prescribed Levaquin  and tessalon  pearls (11/27/2023) for URI; persistent ear congestion in 12/07/2023, noted persistent OM and given doxy and steroid shot on 12/07/2023; on 12/21/2023, still with right ear fullness with frequent popping; she was then given Clinda and dayquil and ref to ENT CBC and CMP 12/04/2023: WBC 7.4, Hgb 12.7, Plt 321; BUN/Cr 20/0.71 03/2024 Audiogram was independently reviewed and interpreted by me and it reveals - left essentially normal downsloping to mod-severe SNHL; right mild downsloping to mod mixed hearing loss; AD WRT 100% at 70dB; AS 96% at 60dB; Tymp type C on right, left type A  SNHL= Sensorineural hearing loss   PMH/Meds/All/SocHx/FamHx/ROS:   Past Medical History:  Diagnosis Date   Cataract    Eczema    Hyperlipidemia    Hypertension    Osteopenia      Past Surgical History:  Procedure Laterality Date   EYE SURGERY  2010   cataract OS    Family History  Problem Relation Age of Onset   Stroke Mother    COPD Father    Heart disease Father    Cancer Brother    Breast cancer Neg Hx    Colon cancer Neg Hx    Colon polyps Neg Hx    Esophageal cancer Neg Hx    Rectal cancer Neg Hx    Stomach cancer Neg Hx    Inflammatory bowel disease Neg Hx    Liver disease Neg Hx    Pancreatic cancer Neg Hx      Social Connections: Moderately Integrated (12/07/2023)   Social Connection and Isolation Panel [NHANES]    Frequency of Communication with Friends and Family: More than three times a week    Frequency of Social Gatherings with Friends and Family: Three times a  week    Attends Religious Services: Never    Active Member of Clubs or Organizations: Yes    Attends Engineer, structural: More than 4 times per year    Marital Status: Married      Current Outpatient Medications:    B Complex Vitamins (B COMPLEX PO), Take by mouth., Disp: , Rfl:    betamethasone  dipropionate 0.05 % cream, APPLY TOPICALLY TWICE A DAY, Disp: 30 g, Rfl: 0   fluticasone   (FLONASE ) 50 MCG/ACT nasal spray, Place 2 sprays into both nostrils 2 (two) times daily., Disp: 16 g, Rfl: 6   loratadine (CLARITIN) 10 MG tablet, Take 10 mg by mouth daily., Disp: , Rfl:    metoprolol  succinate (TOPROL -XL) 25 MG 24 hr tablet, TAKE 1 TABLET BY MOUTH EVERY DAY, Disp: 90 tablet, Rfl: 1   ofloxacin  (FLOXIN ) 0.3 % OTIC solution, Place 4 drops into both ears 2 (two) times daily for 7 days., Disp: 10 mL, Rfl: 1   ofloxacin  (OCUFLOX ) 0.3 % ophthalmic solution, 2 drops in each eye 4 times a day for 5 to 7 days, Disp: 5 mL, Rfl: 1   rosuvastatin  (CRESTOR ) 5 MG tablet, TAKE ONE TAB BY MOUTH TWICE A WEEK FOR MILD HYPERLIPIDEMIA, Disp: 26 tablet, Rfl: 3   SYNTHROID  75 MCG tablet, TAKE 1 TABLET BY MOUTH EVERY DAY, Disp: 90 tablet, Rfl: 1   Vitamin D , Ergocalciferol , (DRISDOL ) 1.25 MG (50000 UNIT) CAPS capsule, TAKE 1 CAPSULE BY MOUTH ONE TIME PER WEEK, Disp: 12 capsule, Rfl: 3   benzonatate  (TESSALON ) 100 MG capsule, Take 1 capsule (100 mg total) by mouth 2 (two) times daily as needed for cough. (Patient not taking: Reported on 03/28/2024), Disp: 14 capsule, Rfl: 0   clindamycin  (CLEOCIN ) 300 MG capsule, Take 1 capsule (300 mg total) by mouth 3 (three) times daily. (Patient not taking: Reported on 03/28/2024), Disp: 21 capsule, Rfl: 0   doxycycline  (VIBRA -TABS) 100 MG tablet, Take 1 tablet (100 mg total) by mouth 2 (two) times daily. (Patient not taking: Reported on 03/28/2024), Disp: 20 tablet, Rfl: 0   Physical Exam:   BP (!) 150/85 (BP Location: Left Arm, Patient Position: Sitting, Cuff Size: Normal)   Pulse 65   Ht 5\' 6"  (1.676 m)   Wt 145 lb (65.8 kg)   SpO2 97%   BMI 23.40 kg/m   Salient findings:  CN II-XII intact Given history and complaints, ear microscopy was indicated and performed for evaluation with findings as below in physical exam section and in procedures; Bilateral EAC clear and TM intact with well pneumatized middle ear space on left; right serous effusion still noted; after  R/B/A, PE tube placed on right.  Weber 512: right Rinne 512: AC > BC b/l Anterior rhinoscopy: Septum relatively midline; bilateral inferior turbinates without significant hypertrophy; Nasal endoscopy was indicated to better evaluate the nose and paranasal sinuses, given the patient's history and exam findings, and is detailed below. No lesions of oral cavity/oropharynx No obviously palpable neck masses/lymphadenopathy/thyromegaly No respiratory distress or stridor  Seprately Identifiable Procedures:   Procedure: Bilateral ear microscopy with RIGHT myringotomy and tympanostomy tube placement (CPT [id number]) Pre-procedure diagnosis:  Right chronic serous otitis media Right conductive hearing loss Right eustachian tube dysfunction Post-procedure diagnosis: same Indication: Patient is a 78 y.o. female with pre-procedure diagnoses above. We discussed options: 1. Observation 2. Nasal sprays 3. Tympanostomy tube. Risks discussed, patient opted for tympanostomy tube placement.  We had a long discussion about benefits and risks of Tympanostomy tube  placement including pain, bleeding, infection, early or late extrusion, TM perforation, otorrhea, injury to middle or external ear structures, nature of tympanostomy tubes, cholesteatoma, hearing loss, among others. Written Consent was obtained prior to proceeding.  Findings: see above; successful anteroinferior tympanostomy tube placement; serous effusion evacuated, not pulsatile  Complications: None apparent  Procedure details: Patient was placed semi recumbent on the exam chair. The right ear was addressed first with binocular microscopy and any cerumen was cleaned from the ear canal. The tympanic membrane was visualized, with findings as above. A focal spot over the right anterior-inferior TM was anesthetized with topical phenol. A myringotomy incision wastthen made radially. Serous effusion was encountered. The middle ear cleft was suctioned. The PE tube  was placed and ciprodex drops were applied. A cotton ball was placed at the meatus.   Patient tolerated the procedure well    Impression & Plans:  Amanda Potts is a 78 y.o. female with:  1. Right chronic serous otitis media   2. Sensation of fullness in right ear   3. Dysfunction of right eustachian tube   Persistent sx despite medical management. We discussed R/B/A of tube including perforation and she opted to proceed Right ear tube placed F/u in 3 months with audio Ofloxacin  BID AD x7d   See below regarding exact medications prescribed this encounter including dosages and route: Meds ordered this encounter  Medications   ofloxacin  (FLOXIN ) 0.3 % OTIC solution    Sig: Place 4 drops into both ears 2 (two) times daily for 7 days.    Dispense:  10 mL    Refill:  1      Thank you for allowing me the opportunity to care for your patient. Please do not hesitate to contact me should you have any other questions.  Sincerely, Milon Aloe, MD Otolaryngologist (ENT), [facility] Phone: [phone] Fax: [phone]  03/28/2024, 1:37 PM   MDM:  Level 4 - [id number] Complexity/Problems addressed: mod = chronic problems Data complexity: low - Morbidity: mod  - Prescription Drug prescribed or managed: yes

## 2024-03-28 NOTE — Progress Notes (Signed)
  [address], [address] [place], [place] [address] [phone]  Audiological Evaluation    Name: Amanda Potts     DOB:   1946/01/29      MRN:   [id number]                                                                                     Service Date: 03/28/2024     Accompanied by: unaccompanied    Patient comes today after Dr. Lydia Sams, ENT sent a referral for a hearing evaluation as a follow up after right ear infection.   Symptoms Yes Details  Hearing loss  [x]  Right ear  Tinnitus  []    Ear pain/ infections/pressure  [x]  Reports right ear feels stopped up since November 23, 2023. Reports that sometimes it pops.  Balance problems  []    Noise exposure history  []    Previous ear surgeries  []    Family history of hearing loss  []    Amplification  []    Other  []      Otoscopy: Right ear: Abnormal eardrum appearance. Left ear:  Clear external ear canals and notable landmarks visualized on the tympanic membrane.  Tympanometry: Right ear: Normal external ear canal volume with negative middle ear pressure and reduced tympanic membrane compliance. Left ear: Type A- Normal external ear canal volume with normal middle ear pressure and tympanic membrane compliance.    Pure tone Audiometry: Right ear- Mild to moderately severe mixed hearing loss from 125 Hz - 8000 Hz. Left ear-  Normal to moderately severe sensorineural hearing loss from 125 Hz - 8000 Hz.  Speech Audiometry: Right ear- Speech Reception Threshold (SRT) was obtained at 35 dBHL. Left ear-Speech Reception Threshold (SRT) was obtained at 25 dBHL.   Word Recognition Score Tested using NU-6 (MLV) Right ear: 100% was obtained at a presentation level of 70 dBHL with contralateral masking which is deemed as  excellent. Left ear: 100% was obtained at a presentation level of 60 dBHL with contralateral masking which is deemed as  excellent.   The hearing test results were completed under headphones and results are deemed to  be of good to fair reliability. Test technique:  conventional    Recommendations: Follow up with ENT as scheduled for today. Repeat audiogram after medical care.   Duey Liller MARIE LEROUX-MARTINEZ, AUD

## 2024-04-08 ENCOUNTER — Encounter: Payer: Self-pay | Admitting: Audiology

## 2024-04-23 ENCOUNTER — Other Ambulatory Visit: Payer: Self-pay | Admitting: Internal Medicine

## 2024-05-07 ENCOUNTER — Other Ambulatory Visit: Payer: Self-pay | Admitting: Internal Medicine

## 2024-05-07 DIAGNOSIS — Z1231 Encounter for screening mammogram for malignant neoplasm of breast: Secondary | ICD-10-CM

## 2024-05-10 ENCOUNTER — Ambulatory Visit
Admission: RE | Admit: 2024-05-10 | Discharge: 2024-05-10 | Disposition: A | Discharge status: Home / Self Care | Source: Ambulatory Visit | Attending: Internal Medicine | Admitting: Internal Medicine

## 2024-05-10 ENCOUNTER — Ambulatory Visit

## 2024-05-10 DIAGNOSIS — Z1231 Encounter for screening mammogram for malignant neoplasm of breast: Secondary | ICD-10-CM

## 2024-07-02 ENCOUNTER — Encounter (INDEPENDENT_AMBULATORY_CARE_PROVIDER_SITE_OTHER): Payer: Self-pay | Admitting: Otolaryngology

## 2024-07-02 ENCOUNTER — Ambulatory Visit (INDEPENDENT_AMBULATORY_CARE_PROVIDER_SITE_OTHER): Admitting: Otolaryngology

## 2024-07-02 ENCOUNTER — Ambulatory Visit (INDEPENDENT_AMBULATORY_CARE_PROVIDER_SITE_OTHER): Admitting: Audiology

## 2024-07-02 VITALS — BP 161/80 | HR 63 | Ht 66.0 in | Wt 145.0 lb

## 2024-07-02 DIAGNOSIS — H90A31 Mixed conductive and sensorineural hearing loss, unilateral, right ear with restricted hearing on the contralateral side: Secondary | ICD-10-CM | POA: Diagnosis not present

## 2024-07-02 DIAGNOSIS — H903 Sensorineural hearing loss, bilateral: Secondary | ICD-10-CM | POA: Diagnosis not present

## 2024-07-02 DIAGNOSIS — H6991 Unspecified Eustachian tube disorder, right ear: Secondary | ICD-10-CM

## 2024-07-02 DIAGNOSIS — H6521 Chronic serous otitis media, right ear: Secondary | ICD-10-CM | POA: Diagnosis not present

## 2024-07-02 DIAGNOSIS — H938X1 Other specified disorders of right ear: Secondary | ICD-10-CM | POA: Diagnosis not present

## 2024-07-02 NOTE — Progress Notes (Signed)
  [address] [place], [place] [address] [phone]  Audiological Evaluation    Name: Amanda Potts     DOB:   Feb 24, 1946      MRN:   [id number]                                                                                     Service Date: 07/02/2024     Accompanied by: unaccompanied   Patient comes today after Dr. Tobie, ENT sent a referral for a hearing evaluation as a follow up for right ear infection and mixed hearing loss.  Symptoms Yes Details  Hearing loss  [x]  03-28-2024: Right ear- Mild to moderately severe mixed hearing loss from 125 Hz - 8000 Hz. Left ear-  Normal to moderately severe sensorineural hearing loss from 125 Hz - 8000 Hz.  Tinnitus  []    Ear pain/ infections/pressure  []    Balance problems  []    Noise exposure history  []    Previous ear surgeries  [x]  Right PE tube: 03-28-24  Family history of hearing loss  []    Amplification  []    Other  []      Otoscopy: Right ear: Clear external ear canal and pressure equalization tube was visualized. Left ear:  Clear external ear canal and notable landmarks visualized on the tympanic membrane.  Tympanometry: Right ear: Type B- Large external ear canal volume with no middle ear pressure peak or tympanic membrane compliance. Left ear: Type A- Normal external ear canal volume with normal middle ear pressure and tympanic membrane compliance.    Pure tone Audiometry: Right ear- Mild to moderately severe sensorineural hearing loss from 125 Hz - 8000 Hz. Left ear-  Normal to moderately severe sensorineural hearing loss from 125 Hz - 8000 Hz.  Speech Audiometry: Right ear- Speech Reception Threshold (SRT) was obtained at 25 dBHL. Left ear-Speech Reception Threshold (SRT) was obtained at 20 dBHL.   Word Recognition Score Tested using NU-6 (recorded) Right ear: 100% was obtained at a presentation level of 70 dBHL with contralateral masking which is deemed as  excellent. Left ear: 100% was obtained at a  presentation level of 70 dBHL with contralateral masking which is deemed as  excellent.   The hearing test results were completed under headphones and results are deemed to be of good reliability. Test technique:  conventional    Impression: There was a significant improvement in right ear's pure-tone thresholds today when compared to the previous audiogram on file.   Recommendations: Follow up with ENT as scheduled for today. Return for a hearing evaluation if concerns with hearing changes arise or per MD recommendation. Consider a communication needs assessment after medical clearance for hearing aids is obtained.   Amanda Potts Amanda Potts, AUD

## 2024-07-02 NOTE — Progress Notes (Signed)
 Dear Dr. Perri, Here is my assessment for our mutual patient, Amanda Potts. Thank you for allowing me the opportunity to care for your patient. Please do not hesitate to contact me should you have any other questions. Sincerely, Dr. Eldora Blanch  Otolaryngology Clinic Note Referring provider: Dr. Perri HPI:  Amanda Potts is a 78 y.o. female kindly referred by Dr. Perri for evaluation of right ear fullness  Initial visit (02/2024): Patient reports: started to have URI symptoms after trip to [place] with hoarse voice, sore throat, rhinorrhea, coughing, malaise in early Jan 2025. Prescribed Levaquin , Doxycycline , Clindamycin , and IM steroid shot without any benefit. Continues to report right ear crackling/gurgling, popping and hearing is muffled in that ear. Left side without issue. She does have ETD symptoms especially when flying and takes decongestants to clear them Patient denies: ear pain, vertigo, drainage, tinnitus Patient also denies barotrauma, vestibular suppressant use, ototoxic medication use Prior ear surgery: no  Does not get frequent sinus infections, but does have allergies (does have sneezing/rhinorrhea, typical AR symptoms). She has not tried nasal sprays but is on PO anthistamine  --------------------------------------------------------- 03/28/2024 She returns for follow up. Is on flonase  and finished prednisone . Still no improvement. No ear pain, vertigo, drainage but still has right sided ear pressure. No headaches. She wishes to proceed with ear tube placement  --------------------------------------------------------- 07/02/2024 Returns for follow up. She is doing very well. Has flown twice without issue. No ear pain, drainage, tinnitus. Hearing has improved. No vertigo. She did have an audiogram and we did discuss this today.  H&N Surgery: no Personal or FHx of bleeding dz or anesthesia difficulty: no   AP/AC: no  Tobacco: no  PMHx: HTN, Hypothyroid,  Eczema  Independent Review of Additional Tests or Records:  Dr. Perri BREEN (multiple notes reviewed including 11/27/2023, 12/07/2023, 12/21/2023): started to have URI symptoms after trip to [place] with hoarse voice, sore throat, rhinorrhea, coughing, malaise in early Jan 2025. Prescribed Levaquin  and tessalon  pearls (11/27/2023) for URI; persistent ear congestion in 12/07/2023, noted persistent OM and given doxy and steroid shot on 12/07/2023; on 12/21/2023, still with right ear fullness with frequent popping; she was then given Clinda and dayquil and ref to ENT CBC and CMP 12/04/2023: WBC 7.4, Hgb 12.7, Plt 321; BUN/Cr 20/0.71 03/2024 Audiogram was independently reviewed and interpreted by me and it reveals - left essentially normal downsloping to mod-severe SNHL; right mild downsloping to mod mixed hearing loss; AD WRT 100% at 70dB; AS 96% at 60dB; Tymp type C on right, left type A  SNHL= Sensorineural hearing loss  07/02/2024 Audiogram was independently reviewed and interpreted by me and it reveals Closure of ABG on AD with b/l borderling downsloping to mod/sev SNHL; WRT 100% at 70dB AU; large vol/A AD/AS tymps  SNHL= Sensorineural hearing loss   PMH/Meds/All/SocHx/FamHx/ROS:   Past Medical History:  Diagnosis Date   Cataract    Eczema    Hyperlipidemia    Hypertension    Osteopenia      Past Surgical History:  Procedure Laterality Date   EYE SURGERY  2010   cataract OS    Family History  Problem Relation Age of Onset   Stroke Mother    COPD Father    Heart disease Father    Cancer Brother    Breast cancer Neg Hx    Colon cancer Neg Hx    Colon polyps Neg Hx    Esophageal cancer Neg Hx    Rectal cancer Neg Hx  Stomach cancer Neg Hx    Inflammatory bowel disease Neg Hx    Liver disease Neg Hx    Pancreatic cancer Neg Hx      Social Connections: Moderately Integrated (12/07/2023)   Social Connection and Isolation Panel    Frequency of Communication with Friends and Family:  More than three times a week    Frequency of Social Gatherings with Friends and Family: Three times a week    Attends Religious Services: Never    Active Member of Clubs or Organizations: Yes    Attends Engineer, structural: More than 4 times per year    Marital Status: Married      Current Outpatient Medications:    B Complex Vitamins (B COMPLEX PO), Take by mouth., Disp: , Rfl:    betamethasone  dipropionate 0.05 % cream, APPLY TOPICALLY TWICE A DAY, Disp: 30 g, Rfl: 0   loratadine (CLARITIN) 10 MG tablet, Take 10 mg by mouth daily., Disp: , Rfl:    metoprolol  succinate (TOPROL -XL) 25 MG 24 hr tablet, TAKE 1 TABLET BY MOUTH EVERY DAY, Disp: 90 tablet, Rfl: 1   ofloxacin  (OCUFLOX ) 0.3 % ophthalmic solution, 2 drops in each eye 4 times a day for 5 to 7 days, Disp: 5 mL, Rfl: 1   rosuvastatin  (CRESTOR ) 5 MG tablet, TAKE ONE TAB BY MOUTH TWICE A WEEK FOR MILD HYPERLIPIDEMIA, Disp: 26 tablet, Rfl: 3   SYNTHROID  75 MCG tablet, TAKE 1 TABLET BY MOUTH EVERY DAY, Disp: 90 tablet, Rfl: 1   Vitamin D , Ergocalciferol , (DRISDOL ) 1.25 MG (50000 UNIT) CAPS capsule, TAKE 1 CAPSULE BY MOUTH ONE TIME PER WEEK, Disp: 12 capsule, Rfl: 3   benzonatate  (TESSALON ) 100 MG capsule, Take 1 capsule (100 mg total) by mouth 2 (two) times daily as needed for cough. (Patient not taking: Reported on 07/02/2024), Disp: 14 capsule, Rfl: 0   clindamycin  (CLEOCIN ) 300 MG capsule, Take 1 capsule (300 mg total) by mouth 3 (three) times daily. (Patient not taking: Reported on 07/02/2024), Disp: 21 capsule, Rfl: 0   doxycycline  (VIBRA -TABS) 100 MG tablet, Take 1 tablet (100 mg total) by mouth 2 (two) times daily. (Patient not taking: Reported on 07/02/2024), Disp: 20 tablet, Rfl: 0   fluticasone  (FLONASE ) 50 MCG/ACT nasal spray, Place 2 sprays into both nostrils 2 (two) times daily. (Patient not taking: Reported on 07/02/2024), Disp: 16 g, Rfl: 6   Physical Exam:   BP (!) 161/80 (BP Location: Right Arm, Patient Position:  Sitting, Cuff Size: Normal)   Pulse 63   Ht 5' 6 (1.676 m)   Wt 145 lb (65.8 kg)   SpO2 97%   BMI 23.40 kg/m   Salient findings:  CN II-XII intact Given history and complaints, ear microscopy was indicated and performed for evaluation with findings as below in physical exam section and in procedures; Bilateral EAC clear and TM intact with well pneumatized middle ear space on left; PE tube on right, patent and dry Weber 512: mid Rinne 512: AC > BC b/l Anterior rhinoscopy: Septum relatively midline; bilateral inferior turbinates without significant hypertrophy No lesions of oral cavity/oropharynx No obviously palpable neck masses/lymphadenopathy/thyromegaly No respiratory distress or stridor  Seprately Identifiable Procedures:   Procedure: Bilateral ear microscopy using microscope (CPT 92504) Pre-procedure diagnosis: right chronic serous otitis media s/p tympanostomy tube placement Post-procedure diagnosis: same Indication: see above; given patient's otologic complaints and history, for improved and comprehensive examination of external ear and tympanic membrane, bilateral otologic examination using microscope was performed. Prior to  proceeding, verbal consent was obtained after discussion of R/B/A  Procedure: Patient was placed semi-recumbent. Both ear canals were examined using the microscope with findings above. Patient tolerated the procedure well.  Impression & Plans:  Amanda Potts is a 78 y.o. female with:  1. Mixed conductive and sensorineural hearing loss of right ear with restricted hearing of left ear   2. Right chronic serous otitis media   3. Sensation of fullness in right ear   4. Dysfunction of right eustachian tube    Persistent sx despite medical management now s/p tympanostomy tube placement Doing well, audio improved Would recommend amplification - she will consider it F/u 6 months with PA, sooner as needed  See below regarding exact medications prescribed  this encounter including dosages and route: No orders of the defined types were placed in this encounter.     Thank you for allowing me the opportunity to care for your patient. Please do not hesitate to contact me should you have any other questions.  Sincerely, Eldora Blanch, MD Otolaryngologist (ENT), [facility] Phone: [phone] Fax: [phone]  07/02/2024, 2:10 PM   MDM:  Level 3 - 99213 Complexity/Problems addressed: mod - chronic problem Data complexity: low - Morbidity: low - Prescription Drug prescribed or managed: no

## 2024-07-04 ENCOUNTER — Other Ambulatory Visit: Payer: Self-pay | Admitting: Internal Medicine

## 2024-08-16 ENCOUNTER — Telehealth: Payer: Self-pay

## 2024-08-16 NOTE — Telephone Encounter (Signed)
 Her last cpe was on 12/07/23, does she want to schedule her next one?    Copied from CRM #[id number]. Topic: Appointments - Scheduling Inquiry for Clinic >> Aug 16, 2024 11:26 AM Emylou G wrote: Reason for CRM: Pls call patient.. says she did do a phys.. but I don't see one - in awhile.SABRA Pls call

## 2024-08-22 ENCOUNTER — Other Ambulatory Visit: Payer: Self-pay | Admitting: Internal Medicine

## 2024-10-13 ENCOUNTER — Other Ambulatory Visit: Payer: Self-pay | Admitting: Internal Medicine

## 2024-12-02 NOTE — Progress Notes (Signed)
 "  Annual Wellness Visit   Patient Care Team: Perri Ronal PARAS, MD as PCP - General (Internal Medicine)  Visit Date: 12/13/24   Chief Complaint  Patient presents with   Annual Exam   Subjective:  Patient: Amanda Potts, Female DOB: 28-Jul-1946, 79 y.o. MRN: [id number] Vitals:   12/13/24 0946 12/13/24 1003  BP: (!) 150/90 (!) 140/88   Amanda Potts is a 79 y.o. Female who presents today for her Annual Wellness Visit. Patient has Hypertension; Osteopenia; Hypothyroidism; History of colon polyps; Eczema; Vitamin D  deficiency; and Colon cancer screening on their problem list.  History of Hypertension treated with 25 mg Metoprolol  succinate daily. Blood pressure is elevated at 140/88.    History of Hypothyroidism treated with 75 mcg Synthroid  daily. 12/10/2024  TSH 2.24.    History of Eczema, usually re-occurs in Winter, treated with Betamethasone  Dipropionate cream.   History of Vitamin D  deficiency treated with Vitamin D  1.25 mg weekly.   History of Hyperlipidemia treated with Rosuvastatin  5 mg twice weekly. 12/10/2024 Lipid Panel CHOL 214, LDL 103.   History of eustachian tube dysfunction. S/p tympanostomy tube placement by Dr. Tobie.    Had a basal cell carcinoma removed from right ankle last year.   Labs 12/10/2024 CHOL 214, LDL 103, Otherwise WNL.   93/75/7974 Mammogram No mammographic evidence of malignancy. Repeat in one year.    09/26/2023 Colonoscopy Hemorrhoids found on digital rectal exam. There was significant looping of the colon. Eight, 1 to 7 mm polyps in the descending colon, in the transverse colon and in the ascending colon. Resected and retrieved. Pathology found to be sessile serrated polyp. Diverticulosis in the recto- sigmoid colon and in the sigmoid colon. Normal mucosa in the entire examined colon otherwise. Non- bleeding non- thrombosed external and internal hemorrhoids. Repeat in 3 years.    PAP Smear 07/31/14 was normal with discontinued repeat.    Bone DEXA  04/28/22 with lowest T-score -0.8, considered normal, with repeat recommendation of 2025. History of Osteopenia treated with 1.25 mg supplemental Vitamin-D.    Vaccine counseling: Tetanus and Shingles vaccines due.   Health Maintenance  Topic Date Due   Zoster Vaccines- Shingrix (1 of 2) Never done   DTaP/Tdap/Td (2 - Td or Tdap) 01/06/2021   Medicare Annual Wellness (AWV)  12/06/2024   COVID-19 Vaccine (9 - 2025-26 season) 06/09/2025   Colonoscopy  09/25/2026   Pneumococcal Vaccine: 50+ Years  Completed   Influenza Vaccine  Completed   Bone Density Scan  Completed   Meningococcal B Vaccine  Aged Out   Mammogram  Discontinued   Hepatitis C Screening  Discontinued    Review of Systems  Constitutional:  Negative for fever and malaise/fatigue.  HENT:  Negative for congestion.   Eyes:  Negative for blurred vision.  Respiratory:  Negative for cough and shortness of breath.   Cardiovascular:  Negative for chest pain, palpitations and leg swelling.  Gastrointestinal:  Negative for vomiting.  Musculoskeletal:  Negative for back pain.  Skin:  Negative for rash.  Neurological:  Negative for loss of consciousness and headaches.   Objective:  Vitals: body mass index is 24.88 kg/m. Today's Vitals   12/13/24 0946 12/13/24 1003  BP: (!) 150/90 (!) 140/88  Pulse: 60   SpO2: 97%   Weight: 153 lb (69.4 kg)   Height: 5' 5.75 (1.67 m)   PainSc: 0-No pain    Physical Exam Vitals and nursing note reviewed. Exam conducted with a chaperone present (Araceli Trosky, CMA).  Constitutional:      General: She is not in acute distress.    Appearance: Normal appearance. She is not ill-appearing or toxic-appearing.  HENT:     Head: Normocephalic and atraumatic.     Right Ear: Hearing, tympanic membrane, ear canal and external ear normal.     Left Ear: Hearing, tympanic membrane, ear canal and external ear normal.     Ears:     Comments: Tube in right ear.     Mouth/Throat:     Pharynx:  Oropharynx is clear.  Eyes:     Extraocular Movements: Extraocular movements intact.     Pupils: Pupils are equal, round, and reactive to light.  Neck:     Thyroid : No thyroid  mass, thyromegaly or thyroid  tenderness.     Vascular: No carotid bruit.  Cardiovascular:     Rate and Rhythm: Normal rate and regular rhythm. No extrasystoles are present.    Pulses:          Dorsalis pedis pulses are 2+ on the right side and 2+ on the left side.     Heart sounds: Normal heart sounds. No murmur heard.    No friction rub. No gallop.  Pulmonary:     Effort: Pulmonary effort is normal.     Breath sounds: Normal breath sounds. No decreased breath sounds, wheezing, rhonchi or rales.  Chest:     Chest wall: No mass.  Abdominal:     Palpations: Abdomen is soft. There is no hepatomegaly, splenomegaly or mass.     Tenderness: There is no abdominal tenderness.     Hernia: No hernia is present.  Musculoskeletal:     Cervical back: Normal range of motion.     Right lower leg: No edema.     Left lower leg: No edema.  Lymphadenopathy:     Cervical: No cervical adenopathy.     Upper Body:     Right upper body: No supraclavicular adenopathy.     Left upper body: No supraclavicular adenopathy.  Skin:    General: Skin is warm and dry.  Neurological:     General: No focal deficit present.     Mental Status: She is alert and oriented to person, place, and time. Mental status is at baseline.     Sensory: Sensation is intact.     Motor: Motor function is intact. No weakness.     Deep Tendon Reflexes: Reflexes are normal and symmetric.  Psychiatric:        Attention and Perception: Attention normal.        Mood and Affect: Mood normal.        Speech: Speech normal.        Behavior: Behavior normal.        Thought Content: Thought content normal.        Cognition and Memory: Cognition normal.        Judgment: Judgment normal.     Current Outpatient Medications  Medication Instructions   aflibercept  (EYLEA HD) 8 mg, Every 3 months   B Complex Vitamins (B COMPLEX PO) Take by mouth.   betamethasone  dipropionate 0.05 % cream APPLY TOPICALLY TWICE A DAY   loratadine (CLARITIN) 10 mg, Daily   metoprolol  succinate (TOPROL -XL) 25 mg, Oral, Daily   ofloxacin  (OCUFLOX ) 0.3 % ophthalmic solution 2 drops in each eye 4 times a day for 5 to 7 days   rosuvastatin  (CRESTOR ) 5 MG tablet TAKE ONE TAB BY MOUTH TWICE A WEEK FOR MILD HYPERLIPIDEMIA  Synthroid  75 mcg, Oral, Daily   Vitamin D , Ergocalciferol , (DRISDOL ) 1.25 MG (50000 UNIT) CAPS capsule TAKE 1 CAPSULE BY MOUTH ONE TIME PER WEEK   Past Medical History:  Diagnosis Date   Cataract    Eczema    Hyperlipidemia    Hypertension    Osteopenia    Medical/Surgical History Narrative:  Allergic/Intolerant to: Allergies[1]  Allergic to Sulfa-Abx   Bilateral Cataract Extractions   History of Osteopenia, previously she has tried Actonel - caused myalgias. Tried Fosamax - caused heartburn. On Vitamin-D supplementation.    Past Surgical History:  Procedure Laterality Date   EYE SURGERY  2010   cataract OS   Family History  Problem Relation Age of Onset   Stroke Mother    COPD Father    Heart disease Father    Cancer Brother    Breast cancer Neg Hx    Colon cancer Neg Hx    Colon polyps Neg Hx    Esophageal cancer Neg Hx    Rectal cancer Neg Hx    Stomach cancer Neg Hx    Inflammatory bowel disease Neg Hx    Liver disease Neg Hx    Pancreatic cancer Neg Hx    Family History Narrative: Father died at age 48 with Dementia and Stroke.  Mother died at age 21 with COPD and Heart Failure.  2 Brothers: 1 Brother living in good health in his late 35s. Other brother died at age 28 with Lung Cancer - was a smoker.   Social History   Social History Narrative   Social history: She is married.  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   Non-smoker.  Social alcohol consumption.  3 adult children.  Daughter resides in [place] and eldest son, Elgin, lives here in [place].  She has several grandchildren.  Enjoys traveling.  Husband is retired.       Family history: Brother living in good health in his late 36s.  Father died at age 20 with dementia and stroke.  Mother died at age 77 with COPD and heart failure.  1 brother died at age 35 with lung cancer and was a smoker.   Most Recent Health Risks Assessment:   Most Recent Social Determinants of Health (Including Hx of Tobacco, Alcohol, and Drug Use) SDOH Screenings   Food Insecurity: No Food Insecurity (12/07/2023)  Housing: Low Risk (12/07/2023)  Transportation Needs: No Transportation Needs (12/07/2023)  Utilities: Not At Risk (12/07/2023)  Alcohol Screen: Low Risk (12/07/2023)  Depression (PHQ2-9): Low Risk (12/13/2024)  Financial Resource Strain: Low Risk (12/07/2023)  Physical Activity: Sufficiently Active (12/07/2023)  Social Connections: Moderately Integrated (12/07/2023)  Stress: No Stress Concern Present (12/07/2023)  Tobacco Use: Low Risk (12/13/2024)  Health Literacy: Adequate Health Literacy (12/07/2023)   Social History[2]   Most Recent Fall Risk Assessment:    12/13/2024   10:00 AM  Fall Risk   Falls in the past year? 1  Number falls in past yr: 0  Injury with Fall? 0  Risk for fall due to : Other (Comment)  Follow up Falls prevention discussed;Education provided;Falls evaluation completed   Most Recent Anxiety/Depression Screenings:    12/13/2024   10:00 AM 12/07/2023    2:58 PM  PHQ 2/9 Scores  PHQ - 2 Score 0 0  PHQ- 9 Score 0       12/13/2024   10:00 AM  GAD  7 : Generalized Anxiety Score  Nervous, Anxious, on Edge 0  Control/stop worrying 0  Worry too much - different things 0  Trouble relaxing 0  Restless 0  Easily annoyed or irritable 0  Afraid - awful might happen 0  Total GAD 7 Score 0  Anxiety Difficulty Not difficult at all   Most  Recent Cognitive Screening:    12/07/2023    3:02 PM  6CIT Screen  What Year? 0 points  What month? 0 points  What time? 0 points  Count back from 20 0 points  Months in reverse 0 points  Repeat phrase 0 points  Total Score 0 points   Results:  Studies Obtained And Personally Reviewed By Me:  05/14/2024 Mammogram No mammographic evidence of malignancy. Repeat in one year.    09/26/2023 Colonoscopy Hemorrhoids found on digital rectal exam. There was significant looping of the colon. Eight, 1 to 7 mm polyps in the descending colon, in the transverse colon and in the ascending colon. Resected and retrieved. Pathology found to be sessile serrated polyp. Diverticulosis in the recto- sigmoid colon and in the sigmoid colon. Normal mucosa in the entire examined colon otherwise. Non- bleeding non- thrombosed external and internal hemorrhoids. Repeat in 3 years.    PAP Smear 07/31/14 was normal with discontinued repeat.    Bone DEXA 04/28/22 with lowest T-score -0.8, considered normal, with repeat recommendation of 2025. History of Osteopenia treated with 1.25 mg supplemental Vitamin-D.    Labs:  CBC w/ Differential Lab Results  Component Value Date   WBC 5.8 12/10/2024   RBC 4.16 12/10/2024   HGB 13.0 12/10/2024   HCT 39.8 12/10/2024   PLT 236 12/10/2024   MCV 95.7 12/10/2024   MCH 31.3 12/10/2024   MCHC 32.7 12/10/2024   RDW 12.6 12/10/2024   MPV 11.3 12/10/2024   LYMPHSABS 1,921 12/01/2022   MONOABS 360 10/07/2016   BASOSABS 41 12/10/2024    Comprehensive Metabolic Panel Lab Results  Component Value Date   NA 141 12/10/2024   K 4.6 12/10/2024   CL 106 12/10/2024   CO2 31 12/10/2024   GLUCOSE 96 12/10/2024   BUN 19 12/10/2024   CREATININE 0.75 12/10/2024   CALCIUM  9.0 12/10/2024   PROT 6.5 12/10/2024   ALBUMIN 4.2 10/07/2016   AST 18 12/10/2024   ALT 15 12/10/2024   ALKPHOS 42 10/07/2016   BILITOT 0.6 12/10/2024   EGFR 81 12/10/2024   GFRNONAA 87 11/24/2020   Lipid  Panel  Lab Results  Component Value Date   CHOL 214 (H) 12/10/2024   HDL 92 12/10/2024   LDLCALC 103 (H) 12/10/2024   TRIG 98 12/10/2024   A1c Lab Results  Component Value Date   HGBA1C 5.6 12/01/2022    TSH Lab Results  Component Value Date   TSH 2.24 12/10/2024    Assessment & Plan:   Hypertension: treated with 25 mg Metoprolol  succinate daily. Blood pressure is elevated at 140/88.    Hypothyroidism: treated with 75 mcg Synthroid  daily. 12/10/2024  TSH 2.24.    Eczema: usually re-occurs in Winter, treated with Betamethasone  Dipropionate cream.   Vitamin D  deficiency: treated with Vitamin D  50,000 units weekly. Level was low at 27 in 2023 and has not been repeated due to expense.  Hyperlipidemia: treated with Rosuvastatin  5 mg twice weekly. 12/10/2024 Lipid Panel CHOL 214, LDL 103.   Eustachian tube dysfunction: S/p tympanostomy tube placement by Dr. Tobie.    Macular degeneration left eye treated with Eylea by  [facility]- see note March 2025.  05/14/2024 Mammogram No mammographic evidence of malignancy. Repeat in one year.    09/26/2023 Colonoscopy Hemorrhoids found on digital rectal exam. There was significant looping of the colon. Eight, 1 to 7 mm polyps in the descending colon, in the transverse colon and in the ascending colon. Resected and retrieved. Pathology found to be sessile serrated polyp. Diverticulosis in the recto- sigmoid colon and in the sigmoid colon. Normal mucosa in the entire examined colon otherwise. Non- bleeding non- thrombosed external and internal hemorrhoids. Repeat in 3 years.    PAP Smear 07/31/14 was normal with discontinued repeat.    Bone DEXA 04/28/22 with lowest T-score -0.8, considered normal, with repeat recommendation of 2025. History of Osteopenia treated with 1.25 mg supplemental Vitamin-D.    Vaccine counseling: Tetanus and Shingles vaccines due.    No follow-ups on file.   Annual Wellness Visit done today  including the all of the following: Reviewed patient's Family Medical History Reviewed patient's SDOH and reviewed tobacco, alcohol, and drug use.  Reviewed and updated list of patient's medical providers Assessment of cognitive impairment was done Assessed patient's functional ability Established a written schedule for health screening services Health Risk Assessent Completed and Reviewed  Discussed health benefits of physical activity, and encouraged her to engage in regular exercise appropriate for her age and condition.    I,Makayla C Reid,acting as a scribe for Ronal JINNY Hailstone, MD.,have documented all relevant documentation on the behalf of Ronal JINNY Hailstone, MD,as directed by  Ronal JINNY Hailstone, MD while in the presence of Ronal JINNY Hailstone, MD.  I, Ronal JINNY Hailstone, MD, have reviewed all documentation for and agree with the above Annual Wellness Visit documentation.  Ronal JINNY Hailstone, MD Internal Medicine 12/13/2024     [1]  Allergies Allergen Reactions   Sulfa Antibiotics Hives  [2]  Social History Tobacco Use   Smoking status: Never   Smokeless tobacco: Never  Vaping Use   Vaping status: Never Used  Substance Use Topics   Alcohol use: Yes    Comment: wine socially   Drug use: No   "

## 2024-12-06 ENCOUNTER — Encounter (INDEPENDENT_AMBULATORY_CARE_PROVIDER_SITE_OTHER): Payer: Self-pay | Admitting: Physician Assistant

## 2024-12-06 ENCOUNTER — Ambulatory Visit (INDEPENDENT_AMBULATORY_CARE_PROVIDER_SITE_OTHER): Admitting: Physician Assistant

## 2024-12-06 VITALS — BP 161/78 | HR 62

## 2024-12-06 DIAGNOSIS — H90A31 Mixed conductive and sensorineural hearing loss, unilateral, right ear with restricted hearing on the contralateral side: Secondary | ICD-10-CM

## 2024-12-06 DIAGNOSIS — H6991 Unspecified Eustachian tube disorder, right ear: Secondary | ICD-10-CM

## 2024-12-06 NOTE — Progress Notes (Signed)
 Dear Dr. Perri, Here is my assessment for our mutual patient, Amanda Potts. Thank you for allowing me the opportunity to care for your patient. Please do not hesitate to contact me should you have any other questions. Sincerely, Chyrl Cohen PA-C  Otolaryngology Clinic Note Referring provider: Dr. Perri HPI:  Amanda Potts is a 79 y.o. female kindly referred by Dr. Perri   79 year old female seen in our office for follow-up evaluation of eustachian tube dysfunction status post tympanostomy tube placement by Dr. Tobie.  She was last seen in the office on 07/02/2024 below is a recap of encounter.  Amanda Potts is a 79 y.o. female kindly referred by Dr. Perri for evaluation of right ear fullness   Initial visit (02/2024): Patient reports: started to have URI symptoms after trip to [place] with hoarse voice, sore throat, rhinorrhea, coughing, malaise in early Jan 2025. Prescribed Levaquin , Doxycycline , Clindamycin , and IM steroid shot without any benefit. Continues to report right ear crackling/gurgling, popping and hearing is muffled in that ear. Left side without issue. She does have ETD symptoms especially when flying and takes decongestants to clear them Patient denies: ear pain, vertigo, drainage, tinnitus Patient also denies barotrauma, vestibular suppressant use, ototoxic medication use Prior ear surgery: no   Does not get frequent sinus infections, but does have allergies (does have sneezing/rhinorrhea, typical AR symptoms). She has not tried nasal sprays but is on PO anthistamine   --------------------------------------------------------- 03/28/2024 She returns for follow up. Is on flonase  and finished prednisone . Still no improvement. No ear pain, vertigo, drainage but still has right sided ear pressure. No headaches. She wishes to proceed with ear tube placement   --------------------------------------------------------- 07/02/2024 Returns for follow up. She is doing very well. Has  flown twice without issue. No ear pain, drainage, tinnitus. Hearing has improved. No vertigo. She did have an audiogram and we did discuss this today.   H&N Surgery: no Personal or FHx of bleeding dz or anesthesia difficulty: no    AP/AC: no   Tobacco: no   PMHx: HTN, Hypothyroid, Eczema  Update 12/06/2024  Doing well today, she denies any significant complaints.  No pain, no drainage.  Hearing is equal bilateral.  She is happy with the tube.  She has flown since tube placement with no significant difficulties and plans on flying soon.      Independent Review of Additional Tests or Records:  Office visit note 07/02/2024   PMH/Meds/All/SocHx/FamHx/ROS:   Past Medical History:  Diagnosis Date   Cataract    Eczema    Hyperlipidemia    Hypertension    Osteopenia      Past Surgical History:  Procedure Laterality Date   EYE SURGERY  2010   cataract OS    Family History  Problem Relation Age of Onset   Stroke Mother    COPD Father    Heart disease Father    Cancer Brother    Breast cancer Neg Hx    Colon cancer Neg Hx    Colon polyps Neg Hx    Esophageal cancer Neg Hx    Rectal cancer Neg Hx    Stomach cancer Neg Hx    Inflammatory bowel disease Neg Hx    Liver disease Neg Hx    Pancreatic cancer Neg Hx      Social Connections: Moderately Integrated (12/07/2023)   Social Connection and Isolation Panel    Frequency of Communication with Friends and Family: More than three times a week    Frequency  of Social Gatherings with Friends and Family: Three times a week    Attends Religious Services: Never    Active Member of Clubs or Organizations: Yes    Attends Engineer, Structural: More than 4 times per year    Marital Status: Married     Current Medications[1]   Physical Exam:   BP (!) 161/78   Pulse 62   SpO2 96%   Pertinent Findings  CN II-XII grossly intact Bilateral EAC clear and , right TM with white tympanostomy tube in place, its patent,  left TM intact well-pneumatized middle ear space No obviouslyneck masses/lymphadenopathy/thyromegaly No respiratory distress or stridor    Seprately Identifiable Procedures:  None  Impression & Plans:  Amanda Potts is a 79 y.o. female with the following    Status post tube placement-  Doing well today no concerns or issues.  Hearing at baseline.  I would like to see her back in the office in 1 year for repeat evaluation unless she develops any new or worsening signs or symptoms in the meantime.  Patient verbalized understanding and agreement to today's plan had no further questions or concerns.  - f/u 1 year   Thank you for allowing me the opportunity to care for your patient. Please do not hesitate to contact me should you have any other questions.  Sincerely, Chyrl Cohen PA-C [facility] Phone: [phone] Fax: [phone]  12/06/2024, 1:49 PM         [1]  Current Outpatient Medications:    B Complex Vitamins (B COMPLEX PO), Take by mouth., Disp: , Rfl:    benzonatate  (TESSALON ) 100 MG capsule, Take 1 capsule (100 mg total) by mouth 2 (two) times daily as needed for cough. (Patient not taking: Reported on 07/02/2024), Disp: 14 capsule, Rfl: 0   betamethasone  dipropionate 0.05 % cream, APPLY TOPICALLY TWICE A DAY, Disp: 30 g, Rfl: 0   clindamycin  (CLEOCIN ) 300 MG capsule, Take 1 capsule (300 mg total) by mouth 3 (three) times daily. (Patient not taking: Reported on 07/02/2024), Disp: 21 capsule, Rfl: 0   doxycycline  (VIBRA -TABS) 100 MG tablet, Take 1 tablet (100 mg total) by mouth 2 (two) times daily. (Patient not taking: Reported on 07/02/2024), Disp: 20 tablet, Rfl: 0   fluticasone  (FLONASE ) 50 MCG/ACT nasal spray, Place 2 sprays into both nostrils 2 (two) times daily. (Patient not taking: Reported on 07/02/2024), Disp: 16 g, Rfl: 6   loratadine (CLARITIN) 10 MG tablet, Take 10 mg by mouth daily., Disp: , Rfl:    metoprolol  succinate (TOPROL -XL) 25 MG 24 hr  tablet, TAKE 1 TABLET BY MOUTH EVERY DAY, Disp: 90 tablet, Rfl: 1   ofloxacin  (OCUFLOX ) 0.3 % ophthalmic solution, 2 drops in each eye 4 times a day for 5 to 7 days, Disp: 5 mL, Rfl: 1   rosuvastatin  (CRESTOR ) 5 MG tablet, TAKE ONE TAB BY MOUTH TWICE A WEEK FOR MILD HYPERLIPIDEMIA, Disp: 26 tablet, Rfl: 3   SYNTHROID  75 MCG tablet, TAKE 1 TABLET BY MOUTH EVERY DAY, Disp: 90 tablet, Rfl: 1   Vitamin D , Ergocalciferol , (DRISDOL ) 1.25 MG (50000 UNIT) CAPS capsule, TAKE 1 CAPSULE BY MOUTH ONE TIME PER WEEK, Disp: 12 capsule, Rfl: 3

## 2024-12-06 NOTE — Progress Notes (Signed)
 Patient took BP medication this morning.

## 2024-12-10 ENCOUNTER — Other Ambulatory Visit: Payer: Self-pay

## 2024-12-10 DIAGNOSIS — E039 Hypothyroidism, unspecified: Secondary | ICD-10-CM

## 2024-12-10 DIAGNOSIS — Z0000 Encounter for general adult medical examination without abnormal findings: Secondary | ICD-10-CM

## 2024-12-10 DIAGNOSIS — I10 Essential (primary) hypertension: Secondary | ICD-10-CM

## 2024-12-10 DIAGNOSIS — E7800 Pure hypercholesterolemia, unspecified: Secondary | ICD-10-CM

## 2024-12-10 DIAGNOSIS — M8580 Other specified disorders of bone density and structure, unspecified site: Secondary | ICD-10-CM

## 2024-12-11 LAB — COMPREHENSIVE METABOLIC PANEL WITH GFR
AG Ratio: 1.8 (calc) (ref 1.0–2.5)
ALT: 15 U/L (ref 6–29)
AST: 18 U/L (ref 10–35)
Albumin: 4.2 g/dL (ref 3.6–5.1)
Alkaline phosphatase (APISO): 54 U/L (ref 37–153)
BUN: 19 mg/dL (ref 7–25)
CO2: 31 mmol/L (ref 20–32)
Calcium: 9 mg/dL (ref 8.6–10.4)
Chloride: 106 mmol/L (ref 98–110)
Creat: 0.75 mg/dL (ref 0.60–1.00)
Globulin: 2.3 g/dL (ref 1.9–3.7)
Glucose, Bld: 96 mg/dL (ref 65–99)
Potassium: 4.6 mmol/L (ref 3.5–5.3)
Sodium: 141 mmol/L (ref 135–146)
Total Bilirubin: 0.6 mg/dL (ref 0.2–1.2)
Total Protein: 6.5 g/dL (ref 6.1–8.1)
eGFR: 81 mL/min/1.73m2

## 2024-12-11 LAB — CBC WITH DIFFERENTIAL/PLATELET
Absolute Lymphocytes: 1491 {cells}/uL (ref 850–3900)
Absolute Monocytes: 586 {cells}/uL (ref 200–950)
Basophils Absolute: 41 {cells}/uL (ref 0–200)
Basophils Relative: 0.7 %
Eosinophils Absolute: 162 {cells}/uL (ref 15–500)
Eosinophils Relative: 2.8 %
HCT: 39.8 % (ref 35.9–46.0)
Hemoglobin: 13 g/dL (ref 11.7–15.5)
MCH: 31.3 pg (ref 27.0–33.0)
MCHC: 32.7 g/dL (ref 31.6–35.4)
MCV: 95.7 fL (ref 81.4–101.7)
MPV: 11.3 fL (ref 7.5–12.5)
Monocytes Relative: 10.1 %
Neutro Abs: 3521 {cells}/uL (ref 1500–7800)
Neutrophils Relative %: 60.7 %
Platelets: 236 Thousand/uL (ref 140–400)
RBC: 4.16 Million/uL (ref 3.80–5.10)
RDW: 12.6 % (ref 11.0–15.0)
Total Lymphocyte: 25.7 %
WBC: 5.8 Thousand/uL (ref 3.8–10.8)

## 2024-12-11 LAB — LIPID PANEL
Cholesterol: 214 mg/dL — ABNORMAL HIGH
HDL: 92 mg/dL
LDL Cholesterol (Calc): 103 mg/dL — ABNORMAL HIGH
Non-HDL Cholesterol (Calc): 122 mg/dL
Total CHOL/HDL Ratio: 2.3 (calc)
Triglycerides: 98 mg/dL

## 2024-12-11 LAB — TSH: TSH: 2.24 m[IU]/L (ref 0.40–4.50)

## 2024-12-13 ENCOUNTER — Encounter: Payer: Self-pay | Admitting: Internal Medicine

## 2024-12-13 ENCOUNTER — Ambulatory Visit: Payer: Self-pay | Admitting: Internal Medicine

## 2024-12-13 VITALS — BP 140/88 | HR 60 | Ht 65.75 in | Wt 153.0 lb

## 2024-12-13 DIAGNOSIS — I10 Essential (primary) hypertension: Secondary | ICD-10-CM | POA: Diagnosis not present

## 2024-12-13 DIAGNOSIS — Z0001 Encounter for general adult medical examination with abnormal findings: Secondary | ICD-10-CM | POA: Diagnosis not present

## 2024-12-13 DIAGNOSIS — E039 Hypothyroidism, unspecified: Secondary | ICD-10-CM | POA: Diagnosis not present

## 2024-12-13 DIAGNOSIS — E785 Hyperlipidemia, unspecified: Secondary | ICD-10-CM | POA: Diagnosis not present

## 2024-12-13 DIAGNOSIS — L309 Dermatitis, unspecified: Secondary | ICD-10-CM | POA: Diagnosis not present

## 2024-12-13 DIAGNOSIS — H6980 Other specified disorders of Eustachian tube, unspecified ear: Secondary | ICD-10-CM

## 2024-12-13 DIAGNOSIS — E559 Vitamin D deficiency, unspecified: Secondary | ICD-10-CM | POA: Diagnosis not present

## 2024-12-13 DIAGNOSIS — Z0000 Encounter for general adult medical examination without abnormal findings: Secondary | ICD-10-CM

## 2024-12-13 DIAGNOSIS — E7800 Pure hypercholesterolemia, unspecified: Secondary | ICD-10-CM

## 2024-12-13 DIAGNOSIS — H3530 Unspecified macular degeneration: Secondary | ICD-10-CM

## 2024-12-13 LAB — POCT URINALYSIS DIP (CLINITEK)
Bilirubin, UA: NEGATIVE
Blood, UA: NEGATIVE
Glucose, UA: NEGATIVE mg/dL
Ketones, POC UA: NEGATIVE mg/dL
Leukocytes, UA: NEGATIVE
Nitrite, UA: NEGATIVE
POC PROTEIN,UA: NEGATIVE
Spec Grav, UA: 1.01
Urobilinogen, UA: 0.2 U/dL
pH, UA: 6.5

## 2024-12-17 ENCOUNTER — Encounter: Payer: Self-pay | Admitting: Internal Medicine

## 2024-12-17 NOTE — Patient Instructions (Signed)
 It was a pleasure to see you today. Return in one year or as needed. Continue current meds.

## 2024-12-22 ENCOUNTER — Other Ambulatory Visit: Payer: Self-pay | Admitting: Internal Medicine

## 2025-01-03 ENCOUNTER — Ambulatory Visit (INDEPENDENT_AMBULATORY_CARE_PROVIDER_SITE_OTHER): Admitting: Physician Assistant

## 2025-12-15 ENCOUNTER — Other Ambulatory Visit

## 2025-12-19 ENCOUNTER — Ambulatory Visit: Admitting: Internal Medicine
